# Patient Record
Sex: Female | Born: 1950
Health system: Southern US, Community
[De-identification: ages and names within clinical notes are randomized; demographics above are authoritative.]

## PROBLEM LIST (undated history)

## (undated) DIAGNOSIS — I10 Essential (primary) hypertension: Secondary | ICD-10-CM

## (undated) DIAGNOSIS — K649 Unspecified hemorrhoids: Secondary | ICD-10-CM

## (undated) DIAGNOSIS — M7541 Impingement syndrome of right shoulder: Secondary | ICD-10-CM

## (undated) DIAGNOSIS — I4891 Unspecified atrial fibrillation: Secondary | ICD-10-CM

## (undated) DIAGNOSIS — E039 Hypothyroidism, unspecified: Secondary | ICD-10-CM

## (undated) DIAGNOSIS — M1990 Unspecified osteoarthritis, unspecified site: Secondary | ICD-10-CM

## (undated) DIAGNOSIS — E785 Hyperlipidemia, unspecified: Secondary | ICD-10-CM

## (undated) DIAGNOSIS — E119 Type 2 diabetes mellitus without complications: Secondary | ICD-10-CM

## (undated) HISTORY — PX: OTHER SURGICAL HISTORY: SHX169

## (undated) HISTORY — DX: Hyperlipidemia, unspecified: E78.5

## (undated) HISTORY — PX: TUBAL LIGATION: SHX77

## (undated) HISTORY — DX: Unspecified atrial fibrillation: I48.91

## (undated) HISTORY — PX: TONSILLECTOMY: SUR1361

---

## 1998-09-04 ENCOUNTER — Other Ambulatory Visit: Admission: RE | Admit: 1998-09-04 | Discharge: 1998-09-04 | Payer: Self-pay | Admitting: Family Medicine

## 1999-07-20 ENCOUNTER — Other Ambulatory Visit: Admission: RE | Admit: 1999-07-20 | Discharge: 1999-07-20 | Payer: Self-pay | Admitting: Family Medicine

## 2000-03-04 ENCOUNTER — Other Ambulatory Visit: Admission: RE | Admit: 2000-03-04 | Discharge: 2000-03-04 | Payer: Self-pay | Admitting: Family Medicine

## 2000-08-19 ENCOUNTER — Ambulatory Visit (HOSPITAL_BASED_OUTPATIENT_CLINIC_OR_DEPARTMENT_OTHER): Admission: RE | Admit: 2000-08-19 | Discharge: 2000-08-19 | Payer: Self-pay | Admitting: *Deleted

## 2000-09-06 ENCOUNTER — Other Ambulatory Visit: Admission: RE | Admit: 2000-09-06 | Discharge: 2000-09-06 | Payer: Self-pay | Admitting: Family Medicine

## 2000-09-19 ENCOUNTER — Encounter: Admission: RE | Admit: 2000-09-19 | Discharge: 2000-09-19 | Payer: Self-pay | Admitting: Family Medicine

## 2000-09-19 ENCOUNTER — Encounter: Payer: Self-pay | Admitting: Family Medicine

## 2001-09-13 ENCOUNTER — Other Ambulatory Visit: Admission: RE | Admit: 2001-09-13 | Discharge: 2001-09-13 | Payer: Self-pay | Admitting: Family Medicine

## 2001-09-21 ENCOUNTER — Encounter: Payer: Self-pay | Admitting: Family Medicine

## 2001-09-21 ENCOUNTER — Encounter: Admission: RE | Admit: 2001-09-21 | Discharge: 2001-09-21 | Payer: Self-pay | Admitting: Family Medicine

## 2002-09-18 ENCOUNTER — Other Ambulatory Visit: Admission: RE | Admit: 2002-09-18 | Discharge: 2002-09-18 | Payer: Self-pay | Admitting: Family Medicine

## 2002-09-24 ENCOUNTER — Encounter: Payer: Self-pay | Admitting: Family Medicine

## 2002-09-24 ENCOUNTER — Encounter: Admission: RE | Admit: 2002-09-24 | Discharge: 2002-09-24 | Payer: Self-pay | Admitting: Family Medicine

## 2003-09-24 ENCOUNTER — Other Ambulatory Visit: Admission: RE | Admit: 2003-09-24 | Discharge: 2003-09-24 | Payer: Self-pay | Admitting: Family Medicine

## 2003-09-26 ENCOUNTER — Encounter: Admission: RE | Admit: 2003-09-26 | Discharge: 2003-09-26 | Payer: Self-pay | Admitting: Family Medicine

## 2003-10-18 ENCOUNTER — Encounter: Admission: RE | Admit: 2003-10-18 | Discharge: 2003-10-18 | Payer: Self-pay | Admitting: Orthopaedic Surgery

## 2004-09-28 ENCOUNTER — Encounter: Admission: RE | Admit: 2004-09-28 | Discharge: 2004-09-28 | Payer: Self-pay | Admitting: Family Medicine

## 2004-09-28 ENCOUNTER — Other Ambulatory Visit: Admission: RE | Admit: 2004-09-28 | Discharge: 2004-09-28 | Payer: Self-pay | Admitting: Family Medicine

## 2005-10-06 ENCOUNTER — Other Ambulatory Visit: Admission: RE | Admit: 2005-10-06 | Discharge: 2005-10-06 | Payer: Self-pay | Admitting: Family Medicine

## 2005-10-21 ENCOUNTER — Encounter: Admission: RE | Admit: 2005-10-21 | Discharge: 2005-10-21 | Payer: Self-pay | Admitting: Family Medicine

## 2005-11-11 ENCOUNTER — Ambulatory Visit: Payer: Self-pay | Admitting: Internal Medicine

## 2005-11-22 ENCOUNTER — Ambulatory Visit: Payer: Self-pay | Admitting: Gastroenterology

## 2006-09-27 ENCOUNTER — Other Ambulatory Visit: Admission: RE | Admit: 2006-09-27 | Discharge: 2006-09-27 | Payer: Self-pay | Admitting: Family Medicine

## 2006-10-24 ENCOUNTER — Encounter: Admission: RE | Admit: 2006-10-24 | Discharge: 2006-10-24 | Payer: Self-pay | Admitting: Family Medicine

## 2007-10-03 ENCOUNTER — Other Ambulatory Visit: Admission: RE | Admit: 2007-10-03 | Discharge: 2007-10-03 | Payer: Self-pay | Admitting: Family Medicine

## 2007-10-27 ENCOUNTER — Encounter: Admission: RE | Admit: 2007-10-27 | Discharge: 2007-10-27 | Payer: Self-pay | Admitting: Family Medicine

## 2008-10-21 ENCOUNTER — Other Ambulatory Visit: Admission: RE | Admit: 2008-10-21 | Discharge: 2008-10-21 | Payer: Self-pay | Admitting: Family Medicine

## 2008-11-04 ENCOUNTER — Encounter: Admission: RE | Admit: 2008-11-04 | Discharge: 2008-11-04 | Payer: Self-pay | Admitting: Family Medicine

## 2009-10-24 ENCOUNTER — Other Ambulatory Visit: Admission: RE | Admit: 2009-10-24 | Discharge: 2009-10-24 | Payer: Self-pay | Admitting: Family Medicine

## 2009-11-05 ENCOUNTER — Encounter: Admission: RE | Admit: 2009-11-05 | Discharge: 2009-11-05 | Payer: Self-pay | Admitting: Family Medicine

## 2010-10-28 ENCOUNTER — Other Ambulatory Visit
Admission: RE | Admit: 2010-10-28 | Discharge: 2010-10-28 | Payer: Self-pay | Source: Home / Self Care | Admitting: Family Medicine

## 2010-11-06 ENCOUNTER — Encounter
Admission: RE | Admit: 2010-11-06 | Discharge: 2010-11-06 | Payer: Self-pay | Source: Home / Self Care | Attending: Family Medicine | Admitting: Family Medicine

## 2010-12-06 ENCOUNTER — Ambulatory Visit (HOSPITAL_COMMUNITY)
Admission: RE | Admit: 2010-12-06 | Discharge: 2010-12-06 | Payer: Self-pay | Source: Home / Self Care | Attending: *Deleted | Admitting: *Deleted
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
## 2011-10-21 ENCOUNTER — Other Ambulatory Visit: Payer: Self-pay | Admitting: Family Medicine

## 2011-10-21 DIAGNOSIS — Z1231 Encounter for screening mammogram for malignant neoplasm of breast: Secondary | ICD-10-CM

## 2011-11-11 ENCOUNTER — Ambulatory Visit
Admission: RE | Admit: 2011-11-11 | Discharge: 2011-11-11 | Disposition: A | Discharge status: Home / Self Care | Payer: Federal, State, Local not specified - PPO | Source: Ambulatory Visit | Attending: Family Medicine | Admitting: Family Medicine

## 2011-11-11 DIAGNOSIS — Z1231 Encounter for screening mammogram for malignant neoplasm of breast: Secondary | ICD-10-CM

## 2011-11-18 ENCOUNTER — Other Ambulatory Visit: Payer: Self-pay | Admitting: Family Medicine

## 2011-11-18 ENCOUNTER — Other Ambulatory Visit (HOSPITAL_COMMUNITY)
Admission: RE | Admit: 2011-11-18 | Discharge: 2011-11-18 | Disposition: A | Discharge status: Home / Self Care | Payer: Federal, State, Local not specified - PPO | Source: Ambulatory Visit | Attending: Family Medicine | Admitting: Family Medicine

## 2011-11-18 DIAGNOSIS — Z01419 Encounter for gynecological examination (general) (routine) without abnormal findings: Secondary | ICD-10-CM | POA: Insufficient documentation

## 2012-10-10 ENCOUNTER — Other Ambulatory Visit: Payer: Self-pay | Admitting: Family Medicine

## 2012-10-10 DIAGNOSIS — Z1231 Encounter for screening mammogram for malignant neoplasm of breast: Secondary | ICD-10-CM

## 2012-11-14 ENCOUNTER — Ambulatory Visit
Admission: RE | Admit: 2012-11-14 | Discharge: 2012-11-14 | Disposition: A | Discharge status: Home / Self Care | Payer: Federal, State, Local not specified - PPO | Source: Ambulatory Visit | Attending: Family Medicine | Admitting: Family Medicine

## 2012-11-14 DIAGNOSIS — Z1231 Encounter for screening mammogram for malignant neoplasm of breast: Secondary | ICD-10-CM

## 2013-10-11 ENCOUNTER — Other Ambulatory Visit: Payer: Self-pay

## 2013-10-11 DIAGNOSIS — Z1231 Encounter for screening mammogram for malignant neoplasm of breast: Secondary | ICD-10-CM

## 2013-11-15 ENCOUNTER — Ambulatory Visit
Admission: RE | Admit: 2013-11-15 | Discharge: 2013-11-15 | Disposition: A | Discharge status: Home / Self Care | Payer: Federal, State, Local not specified - PPO | Source: Ambulatory Visit

## 2013-11-15 DIAGNOSIS — Z1231 Encounter for screening mammogram for malignant neoplasm of breast: Secondary | ICD-10-CM

## 2014-09-16 ENCOUNTER — Other Ambulatory Visit (HOSPITAL_COMMUNITY)
Admission: RE | Admit: 2014-09-16 | Discharge: 2014-09-16 | Disposition: A | Discharge status: Home / Self Care | Payer: Federal, State, Local not specified - PPO | Source: Ambulatory Visit | Attending: Family Medicine | Admitting: Family Medicine

## 2014-09-16 ENCOUNTER — Other Ambulatory Visit: Payer: Self-pay | Admitting: Family Medicine

## 2014-09-16 DIAGNOSIS — Z01419 Encounter for gynecological examination (general) (routine) without abnormal findings: Secondary | ICD-10-CM | POA: Insufficient documentation

## 2014-09-17 LAB — CYTOLOGY - PAP

## 2014-10-14 ENCOUNTER — Other Ambulatory Visit: Payer: Self-pay | Admitting: Family Medicine

## 2014-11-07 ENCOUNTER — Other Ambulatory Visit: Payer: Self-pay

## 2014-11-07 DIAGNOSIS — Z1231 Encounter for screening mammogram for malignant neoplasm of breast: Secondary | ICD-10-CM

## 2014-11-19 ENCOUNTER — Ambulatory Visit
Admission: RE | Admit: 2014-11-19 | Discharge: 2014-11-19 | Disposition: A | Discharge status: Home / Self Care | Payer: Federal, State, Local not specified - PPO | Source: Ambulatory Visit

## 2014-11-19 DIAGNOSIS — Z1231 Encounter for screening mammogram for malignant neoplasm of breast: Secondary | ICD-10-CM

## 2014-11-25 ENCOUNTER — Telehealth (HOSPITAL_COMMUNITY): Payer: Self-pay | Admitting: *Deleted

## 2014-11-25 NOTE — Progress Notes (Signed)
Please put orders in Epic surgery 12-09-14 pre op 12-02-14 Thanks

## 2014-11-26 ENCOUNTER — Ambulatory Visit: Payer: Self-pay | Admitting: Orthopedic Surgery

## 2014-11-26 NOTE — Progress Notes (Signed)
Preoperative surgical orders have been place into the Epic hospital system for Anne Lang on 11/26/2014, 11:15 AM  by Mickel Crow for surgery on 12-09-2014.  Preop Total Knee orders including Experal, IV Tylenol, and IV Decadron as long as there are no contraindications to the above medications. [name], PA-C

## 2014-12-02 ENCOUNTER — Ambulatory Visit (HOSPITAL_COMMUNITY)
Admission: RE | Admit: 2014-12-02 | Discharge: 2014-12-02 | Disposition: A | Discharge status: Home / Self Care | Payer: Federal, State, Local not specified - PPO | Source: Ambulatory Visit | Attending: Anesthesiology | Admitting: Anesthesiology

## 2014-12-02 ENCOUNTER — Encounter (HOSPITAL_COMMUNITY): Payer: Self-pay

## 2014-12-02 ENCOUNTER — Encounter (HOSPITAL_COMMUNITY)
Admission: RE | Admit: 2014-12-02 | Discharge: 2014-12-02 | Disposition: A | Discharge status: Home / Self Care | Payer: Federal, State, Local not specified - PPO | Source: Ambulatory Visit | Attending: Orthopedic Surgery | Admitting: Orthopedic Surgery

## 2014-12-02 DIAGNOSIS — I10 Essential (primary) hypertension: Secondary | ICD-10-CM

## 2014-12-02 HISTORY — DX: Impingement syndrome of right shoulder: M75.41

## 2014-12-02 HISTORY — DX: Unspecified hemorrhoids: K64.9

## 2014-12-02 HISTORY — DX: Hypothyroidism, unspecified: E03.9

## 2014-12-02 HISTORY — DX: Essential (primary) hypertension: I10

## 2014-12-02 HISTORY — DX: Unspecified osteoarthritis, unspecified site: M19.90

## 2014-12-02 HISTORY — DX: Type 2 diabetes mellitus without complications: E11.9

## 2014-12-02 LAB — ABO/RH: ABO/RH(D): A POS

## 2014-12-02 LAB — CBC
HCT: 43.9 % (ref 36.0–46.0)
Hemoglobin: 13.6 g/dL (ref 12.0–15.0)
MCH: 26.8 pg (ref 26.0–34.0)
MCHC: 31 g/dL (ref 30.0–36.0)
MCV: 86.4 fL (ref 78.0–100.0)
PLATELETS: 182 10*3/uL (ref 150–400)
RBC: 5.08 MIL/uL (ref 3.87–5.11)
RDW: 16.2 % — ABNORMAL HIGH (ref 11.5–15.5)
WBC: 9.9 10*3/uL (ref 4.0–10.5)

## 2014-12-02 LAB — URINE MICROSCOPIC-ADD ON

## 2014-12-02 LAB — URINALYSIS, ROUTINE W REFLEX MICROSCOPIC
Bilirubin Urine: NEGATIVE
Glucose, UA: >1000 mg/dL — AB
HGB URINE DIPSTICK: NEGATIVE
KETONES UR: NEGATIVE mg/dL
Leukocytes, UA: NEGATIVE
NITRITE: NEGATIVE
PH: 5.5 (ref 5.0–8.0)
PROTEIN: NEGATIVE mg/dL
Specific Gravity, Urine: 1.028 (ref 1.005–1.030)
Urobilinogen, UA: 0.2 mg/dL (ref 0.0–1.0)

## 2014-12-02 LAB — COMPREHENSIVE METABOLIC PANEL
ALK PHOS: 80 U/L (ref 39–117)
ALT: 33 U/L (ref 0–35)
ANION GAP: 9 (ref 5–15)
AST: 29 U/L (ref 0–37)
Albumin: 4 g/dL (ref 3.5–5.2)
BILIRUBIN TOTAL: 0.4 mg/dL (ref 0.3–1.2)
BUN: 13 mg/dL (ref 6–23)
CHLORIDE: 100 mmol/L (ref 96–112)
CO2: 30 mmol/L (ref 19–32)
Calcium: 9.8 mg/dL (ref 8.4–10.5)
Creatinine, Ser: 0.8 mg/dL (ref 0.50–1.10)
GFR calc Af Amer: 88 mL/min — ABNORMAL LOW (ref >90)
GFR calc non Af Amer: 76 mL/min — ABNORMAL LOW (ref >90)
GLUCOSE: 167 mg/dL — AB (ref 70–99)
Potassium: 3.9 mmol/L (ref 3.5–5.1)
Sodium: 139 mmol/L (ref 135–145)
TOTAL PROTEIN: 6.9 g/dL (ref 6.0–8.3)

## 2014-12-02 LAB — APTT: aPTT: 32 seconds (ref 24–37)

## 2014-12-02 LAB — SURGICAL PCR SCREEN
MRSA, PCR: NEGATIVE
STAPHYLOCOCCUS AUREUS: POSITIVE — AB

## 2014-12-02 LAB — PROTIME-INR
INR: 0.98 (ref 0.00–1.49)
Prothrombin Time: 13.1 seconds (ref 11.6–15.2)

## 2014-12-02 NOTE — Progress Notes (Signed)
Urinalysis results per PAT visit 12/02/2014 per epic sent to Dr Wynelle Link  Clearance note per chart Dr Alyson Ingles 03/04/2014

## 2014-12-02 NOTE — Progress Notes (Addendum)
Results for nasal screening positive for Staph; prescription for Mupirocin called into [facility]- spoke with Coralyn Mark. Pt notified. Lab results sent to Dr Wynelle Link.

## 2014-12-02 NOTE — Patient Instructions (Signed)
[place]  12/02/2014   Your procedure is scheduled on:   Monday December 09, 2014  Report to [facility] Main Entrance and follow signs to  Anne Lang arrive at 6:30 AM.   Call this number if you have problems the morning of surgery [phone] or Presurgical Testing [phone].   Remember:  Do not eat food or drink liquids :After Midnight. Eat a healthy snack night prior to surgery.   For Living Will and/or Health Care Power Attorney Forms: please provide copy for your medical record, may bring AM of surgery (forms should be already notarized-we do not provide this service).     Take these medicines the morning of surgery with A SIP OF WATER: Levothyroxine (Synthroid)                               You may not have any metal on your body including hair pins and piercings  Do not wear jewelry, make-up, lotions, powders,prefumes or deodorant.  Do not shave body hair  48 hours(2 days) of CHG soap use.                Do not bring valuables to the hospital. [place].  Contacts, dentures or bridgework may not be worn into surgery.  Leave suitcase in the car. After surgery it may be brought to your room.  For patients admitted to the hospital, checkout time is 11:00 AM the day of discharge.   Special Instructions: review fact sheets for MRSA information, Blood Transfusion fact sheet, Incentive Spirometry.  Remember: Type/Screen "Blue armsbands"- may not be removed once applied(would result in being retested AM of surgery, if removed). ________________________________________________________________________  [facility] - Preparing for Surgery Before surgery, you can play an important role.  Because skin is not sterile, your skin needs to be as free of germs as possible.  You can reduce the number of germs on your skin by washing with CHG (chlorahexidine gluconate) soap before surgery.  CHG is an antiseptic cleaner which kills germs  and bonds with the skin to continue killing germs even after washing. Please DO NOT use if you have an allergy to CHG or antibacterial soaps.  If your skin becomes reddened/irritated stop using the CHG and inform your nurse when you arrive at Short Stay. Do not shave (including legs and underarms) for at least 48 hours prior to the first CHG shower.  You may shave your face/neck. Please follow these instructions carefully:  1.  Shower with CHG Soap the night before surgery and the  morning of Surgery.  2.  If you choose to wash your hair, wash your hair first as usual with your  normal  shampoo.  3.  After you shampoo, rinse your hair and body thoroughly to remove the  shampoo.                           4.  Use CHG as you would any other liquid soap.  You can apply chg directly  to the skin and wash                       Gently with a scrungie or clean washcloth.  5.  Apply the CHG Soap to your body ONLY FROM THE NECK DOWN.   Do not use on face/ open  Wound or open sores. Avoid contact with eyes, ears mouth and genitals (private parts).                       Wash face,  Genitals (private parts) with your normal soap.             6.  Wash thoroughly, paying special attention to the area where your surgery  will be performed.  7.  Thoroughly rinse your body with warm water from the neck down.  8.  DO NOT shower/wash with your normal soap after using and rinsing off  the CHG Soap.                9.  Pat yourself dry with a clean towel.            10.  Wear clean pajamas.            11.  Place clean sheets on your bed the night of your first shower and do not  sleep with pets. Day of Surgery : Do not apply any lotions/deodorants the morning of surgery.  Please wear clean clothes to the hospital/surgery center.  FAILURE TO FOLLOW THESE INSTRUCTIONS MAY RESULT IN THE CANCELLATION OF YOUR SURGERY PATIENT SIGNATURE_________________________________  NURSE  SIGNATURE__________________________________  ________________________________________________________________________   Anne Lang  An incentive spirometer is a tool that can help keep your lungs clear and active. This tool measures how well you are filling your lungs with each breath. Taking long deep breaths may help reverse or decrease the chance of developing breathing (pulmonary) problems (especially infection) following:  A long period of time when you are unable to move or be active. BEFORE THE PROCEDURE   If the spirometer includes an indicator to show your best effort, your nurse or respiratory therapist will set it to a desired goal.  If possible, sit up straight or lean slightly forward. Try not to slouch.  Hold the incentive spirometer in an upright position. INSTRUCTIONS FOR USE   Sit on the edge of your bed if possible, or sit up as far as you can in bed or on a chair.  Hold the incentive spirometer in an upright position.  Breathe out normally.  Place the mouthpiece in your mouth and seal your lips tightly around it.  Breathe in slowly and as deeply as possible, raising the piston or the ball toward the top of the column.  Hold your breath for 3-5 seconds or for as long as possible. Allow the piston or ball to fall to the bottom of the column.  Remove the mouthpiece from your mouth and breathe out normally.  Rest for a few seconds and repeat Steps 1 through 7 at least 10 times every 1-2 hours when you are awake. Take your time and take a few normal breaths between deep breaths.  The spirometer may include an indicator to show your best effort. Use the indicator as a goal to work toward during each repetition.  After each set of 10 deep breaths, practice coughing to be sure your lungs are clear. If you have an incision (the cut made at the time of surgery), support your incision when coughing by placing a pillow or rolled up towels firmly against it. Once  you are able to get out of bed, walk around indoors and cough well. You may stop using the incentive spirometer when instructed by your caregiver.  RISKS AND COMPLICATIONS  Take your time so you do not  get dizzy or light-headed.  If you are in pain, you may need to take or ask for pain medication before doing incentive spirometry. It is harder to take a deep breath if you are having pain. AFTER USE  Rest and breathe slowly and easily.  It can be helpful to keep track of a log of your progress. Your caregiver can provide you with a simple table to help with this. If you are using the spirometer at home, follow these instructions: [place] IF:   You are having difficultly using the spirometer.  You have trouble using the spirometer as often as instructed.  Your pain medication is not giving enough relief while using the spirometer.  You develop fever of 100.5 F (38.1 C) or higher. SEEK IMMEDIATE MEDICAL CARE IF:   You cough up bloody sputum that had not been present before.  You develop fever of 102 F (38.9 C) or greater.  You develop worsening pain at or near the incision site. MAKE SURE YOU:   Understand these instructions.  Will watch your condition.  Will get help right away if you are not doing well or get worse. Document Released: 03/07/2007 Document Revised: 01/17/2012 Document Reviewed: 05/08/2007 ExitCare Patient Information 2014 ExitCare, [place].   ________________________________________________________________________  WHAT IS A BLOOD TRANSFUSION? Blood Transfusion Information  A transfusion is the replacement of blood or some of its parts. Blood is made up of multiple cells which provide different functions.  Red blood cells carry oxygen and are used for blood loss replacement.  White blood cells fight against infection.  Platelets control bleeding.  Plasma helps clot blood.  Other blood products are available for specialized needs, such as  hemophilia or other clotting disorders. BEFORE THE TRANSFUSION  Who gives blood for transfusions?   Healthy volunteers who are fully evaluated to make sure their blood is safe. This is blood bank blood. Transfusion therapy is the safest it has ever been in the practice of medicine. Before blood is taken from a donor, a complete history is taken to make sure that person has no history of diseases nor engages in risky social behavior (examples are intravenous drug use or sexual activity with multiple partners). The donor's travel history is screened to minimize risk of transmitting infections, such as malaria. The donated blood is tested for signs of infectious diseases, such as HIV and hepatitis. The blood is then tested to be sure it is compatible with you in order to minimize the chance of a transfusion reaction. If you or a relative donates blood, this is often done in anticipation of surgery and is not appropriate for emergency situations. It takes many days to process the donated blood. RISKS AND COMPLICATIONS Although transfusion therapy is very safe and saves many lives, the main dangers of transfusion include:   Getting an infectious disease.  Developing a transfusion reaction. This is an allergic reaction to something in the blood you were given. Every precaution is taken to prevent this. The decision to have a blood transfusion has been considered carefully by your caregiver before blood is given. Blood is not given unless the benefits outweigh the risks. AFTER THE TRANSFUSION  Right after receiving a blood transfusion, you will usually feel much better and more energetic. This is especially true if your red blood cells have gotten low (anemic). The transfusion raises the level of the red blood cells which carry oxygen, and this usually causes an energy increase.  The nurse administering the transfusion will  monitor you carefully for complications. HOME CARE INSTRUCTIONS  No special  instructions are needed after a transfusion. You may find your energy is better. Speak with your caregiver about any limitations on activity for underlying diseases you may have. SEEK MEDICAL CARE IF:   Your condition is not improving after your transfusion.  You develop redness or irritation at the intravenous (IV) site. SEEK IMMEDIATE MEDICAL CARE IF:  Any of the following symptoms occur over the next 12 hours:  Shaking chills.  You have a temperature by mouth above 102 F (38.9 C), not controlled by medicine.  Chest, back, or muscle pain.  People around you feel you are not acting correctly or are confused.  Shortness of breath or difficulty breathing.  Dizziness and fainting.  You get a rash or develop hives.  You have a decrease in urine output.  Your urine turns a dark color or changes to pink, red, or brown. Any of the following symptoms occur over the next 10 days:  You have a temperature by mouth above 102 F (38.9 C), not controlled by medicine.  Shortness of breath.  Weakness after normal activity.  The white part of the eye turns yellow (jaundice).  You have a decrease in the amount of urine or are urinating less often.  Your urine turns a dark color or changes to pink, red, or brown. Document Released: 10/22/2000 Document Revised: 01/17/2012 Document Reviewed: 06/10/2008 [facility] Patient Information 2014 [place], [place].  _______________________________________________________________________

## 2014-12-05 ENCOUNTER — Ambulatory Visit: Payer: Self-pay | Admitting: Orthopedic Surgery

## 2014-12-05 NOTE — H&P (Signed)
Anne Lang DOB: Jun 05, 1951 Single / Language: Cleophus Molt / Race: White Female Date of Admission:  12/09/2014 CC:  Left Knee Pain History of Present Illness The patient is a 64 year old female who comes in for a preoperative History and Physical. The patient is scheduled for a left total knee arthroplasty to be performed by Dr. Dione Plover. Aluisio, MD at [facility] on 12-09-2014. The patient is being followed for their left knee pain and osteoarthritis. They are now 5 month(s) out from a cortisone injection. Symptoms reported today include: pain, pain at night (at times) and swelling. The patient feels that they are doing poorly and report their pain level to be moderate. Current treatment includes: activity modification. The following medication has been used for pain control: antiinflammatory medication (Meloxicam). The patient has reported improvement of their symptoms with: Cortisone injections (but it did not last long). She states her knee is getting progressively worse. This is starting to bow on her more. A cortisone injection helped her for a very short amount of time. Unfortunately, she is getting progressively worse. She is at a stage now she wants to get this fixed so she could be more active and do the things she desires. The knee is currently prohibiting her from doing the things she desires. She is ready to proceed with knee replacement. They have been treated conservatively in the past for the above stated problem and despite conservative measures, they continue to have progressive pain and severe functional limitations and dysfunction. They have failed non-operative management including home exercise, medications, and injections. It is felt that they would benefit from undergoing total joint replacement. Risks and benefits of the procedure have been discussed with the patient and they elect to proceed with surgery. There are no active contraindications to surgery such as ongoing  infection or rapidly progressive neurological disease.  Problem List/Past Medical  Hip pain (M25.559) Impingement syndrome of right shoulder (M75.41) Right shoulder pain (M25.511) Lumbar pain (M54.5) Primary osteoarthritis of one knee, left (M17.12) DDD (degenerative disc disease), lumbar (M51.36) Diabetes Mellitus, Type II High blood pressure Impaired Vision Non-Insulin Dependent Diabetes Mellitus Osteoporosis  Allergies  Sulfanilamide *CHEMICALS* Sulfa drugs  Family History Osteoarthritis father and sister Rheumatoid Arthritis sister Hypertension First Degree Relatives. father, sister and brother Chronic Obstructive Lung Disease sister Congestive Heart Failure mother Heart Disease father, sister and brother  Social History Pain Contract no Number of flights of stairs before winded 1 Illicit drug use no Tobacco / smoke exposure no Children 3 Drug/Alcohol Rehab (Previously) no Living situation live alone Exercise Exercises daily; does running / walking and other Marital status single Current work status working full time Tobacco use Never smoker. never smoker Alcohol use current drinker; drinks wine Drug/Alcohol Rehab (Currently) no Post-Surgical Plans Home Advance Directives Living Will  Medication History  Iron Supplement (325 (65 Fe)MG Tablet, Oral) Active. Skelaxin (800MG  Tablet, Oral as needed) Active. Ramipril (10MG  Capsule, Oral) Active. Levothyroxine Sodium (150MCG Tablet, Oral) Active. (Synthroid) Meloxicam (15MG  Tablet, Oral) Active. Atorvastatin Calcium (10MG  Tablet, Oral) Active. (Lipitor) Hydrochlorothiazide (25MG  Tablet, Oral) Active. Janumet XR (50-500MG  Tablet ER 24HR, Oral) Active. Vitamin C (500MG  Tablet, Oral) Active. Bayer Aspirin EC Low Dose (81MG  Tablet DR, Oral) Active. Centrum Silver Ultra Womens (Oral) Active. Tums (500MG  Tablet Chewable, Oral) Active. Vitamin D (1000UNIT Tablet, Oral)  Active.  Past Surgical History  Tubal Ligation  Review of Systems  General Not Present- Chills, Fatigue, Fever, Memory Loss, Night Sweats, Weight Gain and Weight Loss. Skin  Not Present- Eczema, Hives, Itching, Lesions and Rash. HEENT Not Present- Dentures, Double Vision, Headache, Hearing Loss, Tinnitus and Visual Loss. Respiratory Not Present- Allergies, Chronic Cough, Coughing up blood, Shortness of breath at rest and Shortness of breath with exertion. Cardiovascular Not Present- Chest Pain, Difficulty Breathing Lying Down, Murmur, Palpitations, Racing/skipping heartbeats and Swelling. Gastrointestinal Not Present- Abdominal Pain, Bloody Stool, Constipation, Diarrhea, Difficulty Swallowing, Heartburn, Jaundice, Loss of appetitie, Nausea and Vomiting. Female Genitourinary Not Present- Blood in Urine, Discharge, Flank Pain, Incontinence, Painful Urination, Urgency, Urinary frequency, Urinary Retention, Urinating at Night and Weak urinary stream. Musculoskeletal Present- Joint Pain. Not Present- Back Pain, Joint Swelling, Morning Stiffness, Muscle Pain, Muscle Weakness and Spasms. Neurological Not Present- Blackout spells, Difficulty with balance, Dizziness, Paralysis, Tremor and Weakness. Psychiatric Not Present- Insomnia.  Vitals BP: 128/64 (Sitting, Right Arm, Standard)  Physical Exam  General Mental Status -Alert, cooperative and good historian. General Appearance-pleasant, Not in acute distress. Orientation-Oriented X3. Build & Nutrition-Well nourished and Well developed.  Head and Neck Head-normocephalic, atraumatic . Neck Global Assessment - supple, no bruit auscultated on the right, no bruit auscultated on the left.  Eye Vision-Wears corrective lenses(readers). Pupil - Bilateral-Regular and Round. Motion - Bilateral-EOMI.  Chest and Lung Exam Auscultation Breath sounds - clear at anterior chest wall and clear at posterior chest wall. Adventitious sounds  - No Adventitious sounds.  Cardiovascular Auscultation Rhythm - Regular rate and rhythm. Heart Sounds - S1 WNL and S2 WNL. Murmurs & Other Heart Sounds - Auscultation of the heart reveals - No Murmurs.  Abdomen Inspection Contour - Generalized moderate distention. Palpation/Percussion Tenderness - Abdomen is non-tender to palpation. Rigidity (guarding) - Abdomen is soft. Auscultation Auscultation of the abdomen reveals - Bowel sounds normal.  Female Genitourinary Note: Not done, not pertinent to present illness   Musculoskeletal Note: She is alert and oriented. No apparent distress. Evaluation of her hips show a normal range of motion, no discomfort. The right knee shows no effusion. Range is 0-125. There is no tenderness or instability. The left knee shows a valgus deformity. Range is 5-125. There is marked crepitus on range of motion. Moderate effusion. Tender lateral greater than medial. No instability.  RADIOGRAPHS: AP both knees and lateral are reviewed. She has bone on bone arthritis in the lateral and patellofemoral compartment of the left knee. The right knee has minimal change.   Assessment & Plan Primary osteoarthritis of one knee, left (M17.12) Note:Plan is for a Left Total Knee Replacement by Dr. Wynelle Link.  Plan is to go home with sister and children.  PCP - Dr. Maury Dus - Patient has been seen preoperatively and felt to be stable for surgery. She was instructed to hold the Janumet for 2 days prior to surgery. She was instructed also to stop her aspirin, vitamins and supplements one week prior to the surgery.  The patient does not have any contraindications and will receive TXA (tranexamic acid) prior to surgery.  Signed electronically by Joelene Millin, III PA-C

## 2014-12-09 ENCOUNTER — Inpatient Hospital Stay (HOSPITAL_COMMUNITY): Payer: Federal, State, Local not specified - PPO | Admitting: Anesthesiology

## 2014-12-09 ENCOUNTER — Encounter (HOSPITAL_COMMUNITY): Payer: Self-pay

## 2014-12-09 ENCOUNTER — Inpatient Hospital Stay (HOSPITAL_COMMUNITY)
Admission: RE | Admit: 2014-12-09 | Discharge: 2014-12-11 | DRG: 470 | Disposition: A | Discharge status: Home Health | Payer: Federal, State, Local not specified - PPO | Source: Ambulatory Visit | Attending: Orthopedic Surgery | Admitting: Orthopedic Surgery

## 2014-12-09 ENCOUNTER — Encounter (HOSPITAL_COMMUNITY)
Admission: RE | Disposition: A | Discharge status: Home Health | Payer: Self-pay | Source: Ambulatory Visit | Attending: Orthopedic Surgery

## 2014-12-09 DIAGNOSIS — E119 Type 2 diabetes mellitus without complications: Secondary | ICD-10-CM | POA: Diagnosis present

## 2014-12-09 DIAGNOSIS — I10 Essential (primary) hypertension: Secondary | ICD-10-CM | POA: Diagnosis present

## 2014-12-09 DIAGNOSIS — E039 Hypothyroidism, unspecified: Secondary | ICD-10-CM | POA: Diagnosis present

## 2014-12-09 DIAGNOSIS — M179 Osteoarthritis of knee, unspecified: Secondary | ICD-10-CM | POA: Diagnosis present

## 2014-12-09 DIAGNOSIS — Z7982 Long term (current) use of aspirin: Secondary | ICD-10-CM

## 2014-12-09 DIAGNOSIS — M1712 Unilateral primary osteoarthritis, left knee: Principal | ICD-10-CM | POA: Diagnosis present

## 2014-12-09 DIAGNOSIS — M810 Age-related osteoporosis without current pathological fracture: Secondary | ICD-10-CM | POA: Diagnosis present

## 2014-12-09 DIAGNOSIS — M1710 Unilateral primary osteoarthritis, unspecified knee: Secondary | ICD-10-CM | POA: Diagnosis present

## 2014-12-09 DIAGNOSIS — Z79899 Other long term (current) drug therapy: Secondary | ICD-10-CM

## 2014-12-09 DIAGNOSIS — M25562 Pain in left knee: Secondary | ICD-10-CM | POA: Diagnosis present

## 2014-12-09 HISTORY — PX: TOTAL KNEE ARTHROPLASTY: SHX125

## 2014-12-09 LAB — TYPE AND SCREEN
ABO/RH(D): A POS
ANTIBODY SCREEN: NEGATIVE

## 2014-12-09 LAB — GLUCOSE, CAPILLARY
GLUCOSE-CAPILLARY: 181 mg/dL — AB (ref 70–99)
Glucose-Capillary: 150 mg/dL — ABNORMAL HIGH (ref 70–99)
Glucose-Capillary: 157 mg/dL — ABNORMAL HIGH (ref 70–99)
Glucose-Capillary: 212 mg/dL — ABNORMAL HIGH (ref 70–99)
Glucose-Capillary: 151 mg/dL — ABNORMAL HIGH (ref 70–99)

## 2014-12-09 SURGERY — ARTHROPLASTY, KNEE, TOTAL
Anesthesia: Spinal | Site: Knee | Laterality: Left

## 2014-12-09 MED ORDER — DOCUSATE SODIUM 100 MG PO CAPS
100.0000 mg | ORAL_CAPSULE | Freq: Two times a day (BID) | ORAL | Status: DC
  Administered 2014-12-09 – 2014-12-11 (×5): 100 mg via ORAL

## 2014-12-09 MED ORDER — PROPOFOL 10 MG/ML IV BOLUS
INTRAVENOUS | Status: AC
  Filled 2014-12-09: qty 20

## 2014-12-09 MED ORDER — DIPHENHYDRAMINE HCL 12.5 MG/5ML PO ELIX: 12.5000 mg | ORAL_SOLUTION | ORAL | Status: DC | PRN

## 2014-12-09 MED ORDER — ACETAMINOPHEN 10 MG/ML IV SOLN
1000.0000 mg | Freq: Once | INTRAVENOUS | Status: AC
  Administered 2014-12-09: 1000 mg via INTRAVENOUS
  Filled 2014-12-09 (×2): qty 100

## 2014-12-09 MED ORDER — FLEET ENEMA 7-19 GM/118ML RE ENEM: 1.0000 | ENEMA | Freq: Once | RECTAL | Status: AC | PRN

## 2014-12-09 MED ORDER — FENTANYL CITRATE 0.05 MG/ML IJ SOLN
INTRAMUSCULAR | Status: DC | PRN
  Administered 2014-12-09 (×2): 50 ug via INTRAVENOUS

## 2014-12-09 MED ORDER — SODIUM CHLORIDE 0.9 % IV SOLN: INTRAVENOUS | Status: DC

## 2014-12-09 MED ORDER — BISACODYL 10 MG RE SUPP: 10.0000 mg | Freq: Every day | RECTAL | Status: DC | PRN

## 2014-12-09 MED ORDER — TRAMADOL HCL 50 MG PO TABS: 50.0000 mg | ORAL_TABLET | Freq: Four times a day (QID) | ORAL | Status: DC | PRN

## 2014-12-09 MED ORDER — SODIUM CHLORIDE 0.9 % IJ SOLN
INTRAMUSCULAR | Status: DC | PRN
  Administered 2014-12-09: 30 mL via INTRAVENOUS

## 2014-12-09 MED ORDER — ATORVASTATIN CALCIUM 10 MG PO TABS
5.0000 mg | ORAL_TABLET | Freq: Every day | ORAL | Status: DC
  Administered 2014-12-09 – 2014-12-11 (×3): 5 mg via ORAL
  Filled 2014-12-09 (×3): qty 0.5

## 2014-12-09 MED ORDER — POTASSIUM CHLORIDE IN NACL 20-0.9 MEQ/L-% IV SOLN
INTRAVENOUS | Status: DC
  Administered 2014-12-09: 15:00:00 via INTRAVENOUS
  Filled 2014-12-09 (×2): qty 1000

## 2014-12-09 MED ORDER — MEPERIDINE HCL 50 MG/ML IJ SOLN: 6.2500 mg | INTRAMUSCULAR | Status: DC | PRN

## 2014-12-09 MED ORDER — PROMETHAZINE HCL 25 MG/ML IJ SOLN: 6.2500 mg | INTRAMUSCULAR | Status: DC | PRN

## 2014-12-09 MED ORDER — BUPIVACAINE LIPOSOME 1.3 % IJ SUSP
20.0000 mL | Freq: Once | INTRAMUSCULAR | Status: DC
  Filled 2014-12-09: qty 20

## 2014-12-09 MED ORDER — METOCLOPRAMIDE HCL 10 MG PO TABS: 5.0000 mg | ORAL_TABLET | Freq: Three times a day (TID) | ORAL | Status: DC | PRN

## 2014-12-09 MED ORDER — SODIUM CHLORIDE 0.9 % IJ SOLN
INTRAMUSCULAR | Status: AC
  Filled 2014-12-09: qty 50

## 2014-12-09 MED ORDER — OXYCODONE HCL 5 MG PO TABS
5.0000 mg | ORAL_TABLET | ORAL | Status: DC | PRN
  Administered 2014-12-09: 5 mg via ORAL
  Administered 2014-12-09 (×3): 10 mg via ORAL
  Administered 2014-12-09: 5 mg via ORAL
  Administered 2014-12-10 – 2014-12-11 (×10): 10 mg via ORAL
  Filled 2014-12-09 (×7): qty 2
  Filled 2014-12-09: qty 1
  Filled 2014-12-09 (×6): qty 2
  Filled 2014-12-09: qty 1

## 2014-12-09 MED ORDER — DEXAMETHASONE SODIUM PHOSPHATE 10 MG/ML IJ SOLN
INTRAMUSCULAR | Status: AC
  Filled 2014-12-09: qty 1

## 2014-12-09 MED ORDER — HYDROCHLOROTHIAZIDE 25 MG PO TABS
25.0000 mg | ORAL_TABLET | Freq: Every morning | ORAL | Status: DC
  Administered 2014-12-09 – 2014-12-11 (×3): 25 mg via ORAL
  Filled 2014-12-09 (×3): qty 1

## 2014-12-09 MED ORDER — ONDANSETRON HCL 4 MG/2ML IJ SOLN
INTRAMUSCULAR | Status: AC
  Filled 2014-12-09: qty 2

## 2014-12-09 MED ORDER — CHLORHEXIDINE GLUCONATE 4 % EX LIQD: 60.0000 mL | Freq: Once | CUTANEOUS | Status: DC

## 2014-12-09 MED ORDER — ONDANSETRON HCL 4 MG/2ML IJ SOLN
INTRAMUSCULAR | Status: DC | PRN
  Administered 2014-12-09: 4 mg via INTRAVENOUS

## 2014-12-09 MED ORDER — DEXAMETHASONE SODIUM PHOSPHATE 10 MG/ML IJ SOLN
10.0000 mg | Freq: Once | INTRAMUSCULAR | Status: AC
  Administered 2014-12-10: 10 mg via INTRAVENOUS
  Filled 2014-12-09: qty 1

## 2014-12-09 MED ORDER — BUPIVACAINE IN DEXTROSE 0.75-8.25 % IT SOLN
INTRATHECAL | Status: DC | PRN
  Administered 2014-12-09: 1.5 mL via INTRATHECAL

## 2014-12-09 MED ORDER — MORPHINE SULFATE 2 MG/ML IJ SOLN
1.0000 mg | INTRAMUSCULAR | Status: DC | PRN
  Administered 2014-12-09 (×2): 1 mg via INTRAVENOUS
  Administered 2014-12-09 – 2014-12-10 (×4): 2 mg via INTRAVENOUS
  Filled 2014-12-09 (×5): qty 1

## 2014-12-09 MED ORDER — ACETAMINOPHEN 325 MG PO TABS: 650.0000 mg | ORAL_TABLET | Freq: Four times a day (QID) | ORAL | Status: DC | PRN

## 2014-12-09 MED ORDER — MIDAZOLAM HCL 5 MG/5ML IJ SOLN
INTRAMUSCULAR | Status: DC | PRN
  Administered 2014-12-09 (×2): 1 mg via INTRAVENOUS

## 2014-12-09 MED ORDER — LACTATED RINGERS IV SOLN
INTRAVENOUS | Status: DC | PRN
  Administered 2014-12-09 (×3): via INTRAVENOUS

## 2014-12-09 MED ORDER — PROPOFOL INFUSION 10 MG/ML OPTIME
INTRAVENOUS | Status: DC | PRN
  Administered 2014-12-09: 75 ug/kg/min via INTRAVENOUS

## 2014-12-09 MED ORDER — BUPIVACAINE LIPOSOME 1.3 % IJ SUSP
INTRAMUSCULAR | Status: DC | PRN
  Administered 2014-12-09: 20 mL

## 2014-12-09 MED ORDER — INSULIN ASPART 100 UNIT/ML ~~LOC~~ SOLN
0.0000 [IU] | Freq: Three times a day (TID) | SUBCUTANEOUS | Status: DC
  Administered 2014-12-09: 5 [IU] via SUBCUTANEOUS
  Administered 2014-12-10: 8 [IU] via SUBCUTANEOUS
  Administered 2014-12-10: 3 [IU] via SUBCUTANEOUS
  Administered 2014-12-10: 5 [IU] via SUBCUTANEOUS
  Administered 2014-12-11: 2 [IU] via SUBCUTANEOUS

## 2014-12-09 MED ORDER — BUPIVACAINE HCL 0.25 % IJ SOLN
INTRAMUSCULAR | Status: DC | PRN
  Administered 2014-12-09: 20 mL

## 2014-12-09 MED ORDER — CEFAZOLIN SODIUM-DEXTROSE 2-3 GM-% IV SOLR
INTRAVENOUS | Status: AC
  Filled 2014-12-09: qty 50

## 2014-12-09 MED ORDER — MENTHOL 3 MG MT LOZG
1.0000 | LOZENGE | OROMUCOSAL | Status: DC | PRN
  Filled 2014-12-09: qty 9

## 2014-12-09 MED ORDER — DEXAMETHASONE SODIUM PHOSPHATE 10 MG/ML IJ SOLN
10.0000 mg | Freq: Once | INTRAMUSCULAR | Status: AC
  Administered 2014-12-09: 10 mg via INTRAVENOUS

## 2014-12-09 MED ORDER — LACTATED RINGERS IV SOLN: INTRAVENOUS | Status: DC

## 2014-12-09 MED ORDER — CEFAZOLIN SODIUM-DEXTROSE 2-3 GM-% IV SOLR
2.0000 g | Freq: Four times a day (QID) | INTRAVENOUS | Status: AC
  Administered 2014-12-09 (×2): 2 g via INTRAVENOUS
  Filled 2014-12-09 (×2): qty 50

## 2014-12-09 MED ORDER — FERROUS SULFATE 325 (65 FE) MG PO TABS
325.0000 mg | ORAL_TABLET | Freq: Every day | ORAL | Status: DC
  Administered 2014-12-10 – 2014-12-11 (×2): 325 mg via ORAL
  Filled 2014-12-09 (×3): qty 1

## 2014-12-09 MED ORDER — SODIUM CHLORIDE 0.9 % IV SOLN
1000.0000 mg | INTRAVENOUS | Status: AC
  Administered 2014-12-09: 1000 mg via INTRAVENOUS
  Filled 2014-12-09: qty 10

## 2014-12-09 MED ORDER — ONDANSETRON HCL 4 MG PO TABS: 4.0000 mg | ORAL_TABLET | Freq: Four times a day (QID) | ORAL | Status: DC | PRN

## 2014-12-09 MED ORDER — RIVAROXABAN 10 MG PO TABS
10.0000 mg | ORAL_TABLET | Freq: Every day | ORAL | Status: DC
  Administered 2014-12-10 – 2014-12-11 (×2): 10 mg via ORAL
  Filled 2014-12-09 (×3): qty 1

## 2014-12-09 MED ORDER — FENTANYL CITRATE 0.05 MG/ML IJ SOLN
INTRAMUSCULAR | Status: AC
  Filled 2014-12-09: qty 2

## 2014-12-09 MED ORDER — METHOCARBAMOL 500 MG PO TABS
500.0000 mg | ORAL_TABLET | Freq: Four times a day (QID) | ORAL | Status: DC | PRN
  Administered 2014-12-09 – 2014-12-11 (×5): 500 mg via ORAL
  Filled 2014-12-09 (×5): qty 1

## 2014-12-09 MED ORDER — MIDAZOLAM HCL 2 MG/2ML IJ SOLN
INTRAMUSCULAR | Status: AC
  Filled 2014-12-09: qty 2

## 2014-12-09 MED ORDER — CEFAZOLIN SODIUM-DEXTROSE 2-3 GM-% IV SOLR
2.0000 g | INTRAVENOUS | Status: AC
  Administered 2014-12-09: 2 g via INTRAVENOUS

## 2014-12-09 MED ORDER — METOCLOPRAMIDE HCL 5 MG/ML IJ SOLN: 5.0000 mg | Freq: Three times a day (TID) | INTRAMUSCULAR | Status: DC | PRN

## 2014-12-09 MED ORDER — ACETAMINOPHEN 500 MG PO TABS
1000.0000 mg | ORAL_TABLET | Freq: Four times a day (QID) | ORAL | Status: AC
  Administered 2014-12-09 – 2014-12-10 (×4): 1000 mg via ORAL
  Filled 2014-12-09 (×4): qty 2

## 2014-12-09 MED ORDER — KETOROLAC TROMETHAMINE 15 MG/ML IJ SOLN
7.5000 mg | Freq: Four times a day (QID) | INTRAMUSCULAR | Status: AC | PRN
  Administered 2014-12-09: 7.5 mg via INTRAVENOUS
  Filled 2014-12-09: qty 1

## 2014-12-09 MED ORDER — HYDROMORPHONE HCL 1 MG/ML IJ SOLN
INTRAMUSCULAR | Status: AC
  Filled 2014-12-09: qty 1

## 2014-12-09 MED ORDER — BUPIVACAINE HCL (PF) 0.25 % IJ SOLN
INTRAMUSCULAR | Status: AC
  Filled 2014-12-09: qty 30

## 2014-12-09 MED ORDER — 0.9 % SODIUM CHLORIDE (POUR BTL) OPTIME
TOPICAL | Status: DC | PRN
  Administered 2014-12-09: 1000 mL

## 2014-12-09 MED ORDER — STERILE WATER FOR IRRIGATION IR SOLN
Status: DC | PRN
  Administered 2014-12-09: 1500 mL

## 2014-12-09 MED ORDER — ACETAMINOPHEN 650 MG RE SUPP: 650.0000 mg | Freq: Four times a day (QID) | RECTAL | Status: DC | PRN

## 2014-12-09 MED ORDER — LEVOTHYROXINE SODIUM 150 MCG PO TABS
150.0000 ug | ORAL_TABLET | Freq: Every day | ORAL | Status: DC
  Administered 2014-12-10 – 2014-12-11 (×2): 150 ug via ORAL
  Filled 2014-12-09 (×3): qty 1

## 2014-12-09 MED ORDER — ONDANSETRON HCL 4 MG/2ML IJ SOLN: 4.0000 mg | Freq: Four times a day (QID) | INTRAMUSCULAR | Status: DC | PRN

## 2014-12-09 MED ORDER — POLYETHYLENE GLYCOL 3350 17 G PO PACK: 17.0000 g | PACK | Freq: Every day | ORAL | Status: DC | PRN

## 2014-12-09 MED ORDER — HYDROMORPHONE HCL 1 MG/ML IJ SOLN
0.2500 mg | INTRAMUSCULAR | Status: DC | PRN
  Administered 2014-12-09 (×2): 0.5 mg via INTRAVENOUS

## 2014-12-09 MED ORDER — RAMIPRIL 10 MG PO CAPS
10.0000 mg | ORAL_CAPSULE | Freq: Every morning | ORAL | Status: DC
  Administered 2014-12-09 – 2014-12-11 (×3): 10 mg via ORAL
  Filled 2014-12-09 (×3): qty 1

## 2014-12-09 MED ORDER — EMPAGLIFLOZIN 25 MG PO TABS
25.0000 mg | ORAL_TABLET | Freq: Every day | ORAL | Status: DC
  Administered 2014-12-11: 25 mg via ORAL
  Filled 2014-12-09 (×4): qty 25

## 2014-12-09 MED ORDER — DEXTROSE 5 % IV SOLN
500.0000 mg | Freq: Four times a day (QID) | INTRAVENOUS | Status: DC | PRN
  Administered 2014-12-09: 500 mg via INTRAVENOUS
  Filled 2014-12-09 (×2): qty 5

## 2014-12-09 MED ORDER — PHENOL 1.4 % MT LIQD
1.0000 | OROMUCOSAL | Status: DC | PRN
Start: 2014-12-09 — End: 2014-12-11
  Filled 2014-12-09: qty 177

## 2014-12-09 SURGICAL SUPPLY — 65 items
BAG SPEC THK2 15X12 ZIP CLS (MISCELLANEOUS) ×1
BAG ZIPLOCK 12X15 (MISCELLANEOUS) ×3 IMPLANT
BANDAGE ELASTIC 6 VELCRO ST LF (GAUZE/BANDAGES/DRESSINGS) ×3 IMPLANT
BANDAGE ESMARK 6X9 LF (GAUZE/BANDAGES/DRESSINGS) ×1 IMPLANT
BLADE SAG 18X100X1.27 (BLADE) ×3 IMPLANT
BLADE SAW SGTL 11.0X1.19X90.0M (BLADE) ×3 IMPLANT
BNDG CMPR 82X61 PLY HI ABS (GAUZE/BANDAGES/DRESSINGS) ×1
BNDG CMPR 9X6 STRL LF SNTH (GAUZE/BANDAGES/DRESSINGS) ×1
BNDG CONFORM 6X.82 1P STRL (GAUZE/BANDAGES/DRESSINGS) ×2 IMPLANT
BNDG ESMARK 6X9 LF (GAUZE/BANDAGES/DRESSINGS) ×3
BOWL SMART MIX CTS (DISPOSABLE) ×3 IMPLANT
CAPT KNEE TOTAL 3 ATTUNE ×2 IMPLANT
CEMENT HV SMART SET (Cement) ×6 IMPLANT
CLOSURE WOUND 1/2 X4 (GAUZE/BANDAGES/DRESSINGS) ×2
CUFF TOURN SGL QUICK 34 (TOURNIQUET CUFF) ×3
CUFF TRNQT CYL 34X4X40X1 (TOURNIQUET CUFF) ×1 IMPLANT
DECANTER SPIKE VIAL GLASS SM (MISCELLANEOUS) ×3 IMPLANT
DRAPE EXTREMITY T 121X128X90 (DRAPE) ×3 IMPLANT
DRAPE POUCH INSTRU U-SHP 10X18 (DRAPES) ×3 IMPLANT
DRAPE U-SHAPE 47X51 STRL (DRAPES) ×3 IMPLANT
DRSG ADAPTIC 3X8 NADH LF (GAUZE/BANDAGES/DRESSINGS) ×3 IMPLANT
DRSG PAD ABDOMINAL 8X10 ST (GAUZE/BANDAGES/DRESSINGS) ×3 IMPLANT
DURAPREP 26ML APPLICATOR (WOUND CARE) ×3 IMPLANT
ELECT REM PT RETURN 9FT ADLT (ELECTROSURGICAL) ×3
ELECTRODE REM PT RTRN 9FT ADLT (ELECTROSURGICAL) ×1 IMPLANT
EVACUATOR 1/8 PVC DRAIN (DRAIN) ×3 IMPLANT
FACESHIELD WRAPAROUND (MASK) ×15 IMPLANT
FACESHIELD WRAPAROUND OR TEAM (MASK) ×5 IMPLANT
GAUZE SPONGE 4X4 12PLY STRL (GAUZE/BANDAGES/DRESSINGS) ×3 IMPLANT
GLOVE BIO SURGEON STRL SZ7.5 (GLOVE) IMPLANT
GLOVE BIO SURGEON STRL SZ8 (GLOVE) ×3 IMPLANT
GLOVE BIOGEL PI IND STRL 6.5 (GLOVE) IMPLANT
GLOVE BIOGEL PI IND STRL 8 (GLOVE) ×1 IMPLANT
GLOVE BIOGEL PI INDICATOR 6.5 (GLOVE)
GLOVE BIOGEL PI INDICATOR 8 (GLOVE) ×2
GLOVE SURG SS PI 6.5 STRL IVOR (GLOVE) IMPLANT
GOWN STRL REUS W/TWL LRG LVL3 (GOWN DISPOSABLE) ×3 IMPLANT
GOWN STRL REUS W/TWL XL LVL3 (GOWN DISPOSABLE) IMPLANT
HANDPIECE INTERPULSE COAX TIP (DISPOSABLE) ×3
IMMOBILIZER KNEE 20 (SOFTGOODS) ×5 IMPLANT
IMMOBILIZER KNEE 20 THIGH 36 (SOFTGOODS) ×1 IMPLANT
KIT BASIN OR (CUSTOM PROCEDURE TRAY) ×3 IMPLANT
MANIFOLD NEPTUNE II (INSTRUMENTS) ×3 IMPLANT
NDL SAFETY ECLIPSE 18X1.5 (NEEDLE) ×2 IMPLANT
NEEDLE HYPO 18GX1.5 SHARP (NEEDLE) ×6
NS IRRIG 1000ML POUR BTL (IV SOLUTION) ×3 IMPLANT
PACK TOTAL JOINT (CUSTOM PROCEDURE TRAY) ×3 IMPLANT
PAD ABD 8X10 STRL (GAUZE/BANDAGES/DRESSINGS) ×2 IMPLANT
PADDING CAST COTTON 6X4 STRL (CAST SUPPLIES) ×9 IMPLANT
POSITIONER SURGICAL ARM (MISCELLANEOUS) ×3 IMPLANT
SET HNDPC FAN SPRY TIP SCT (DISPOSABLE) ×1 IMPLANT
STRIP CLOSURE SKIN 1/2X4 (GAUZE/BANDAGES/DRESSINGS) ×4 IMPLANT
SUCTION FRAZIER 12FR DISP (SUCTIONS) ×3 IMPLANT
SUT MNCRL AB 4-0 PS2 18 (SUTURE) ×3 IMPLANT
SUT VIC AB 2-0 CT1 27 (SUTURE) ×9
SUT VIC AB 2-0 CT1 TAPERPNT 27 (SUTURE) ×3 IMPLANT
SUT VLOC 180 0 24IN GS25 (SUTURE) ×3 IMPLANT
SYR 20CC LL (SYRINGE) ×3 IMPLANT
SYR 50ML LL SCALE MARK (SYRINGE) ×3 IMPLANT
TAPE STRIPS DRAPE STRL (GAUZE/BANDAGES/DRESSINGS) ×3 IMPLANT
TOWEL OR 17X26 10 PK STRL BLUE (TOWEL DISPOSABLE) ×3 IMPLANT
TOWEL OR NON WOVEN STRL DISP B (DISPOSABLE) IMPLANT
TRAY FOLEY CATH 14FRSI W/METER (CATHETERS) ×3 IMPLANT
WATER STERILE IRR 1500ML POUR (IV SOLUTION) ×3 IMPLANT
WRAP KNEE MAXI GEL POST OP (GAUZE/BANDAGES/DRESSINGS) ×3 IMPLANT

## 2014-12-09 NOTE — Anesthesia Postprocedure Evaluation (Signed)
  Anesthesia Post-op Note  Patient: Anne Lang  Procedure(s) Performed: Procedure(s) (LRB): LEFT TOTAL KNEE ARTHROPLASTY (Left)  Patient Location: PACU  Anesthesia Type: Spinal  Level of Consciousness: awake and alert   Airway and Oxygen Therapy: Patient Spontanous Breathing  Post-op Pain: mild  Post-op Assessment: Post-op Vital signs reviewed, Patient's Cardiovascular Status Stable, Respiratory Function Stable, Patent Airway and No signs of Nausea or vomiting  Last Vitals:  Filed Vitals:   12/09/14 1145  BP: 111/57  Pulse: 54  Temp: 36.4 C  Resp: 15    Post-op Vital Signs: stable   Complications: No apparent anesthesia complications

## 2014-12-09 NOTE — H&P (View-Only) (Signed)
Anne Lang DOB: 1951/02/23 Single / Language: Cleophus Molt / Race: White Female Date of Admission:  12/09/2014 CC:  Left Knee Pain History of Present Illness The patient is a 64 year old female who comes in for a preoperative History and Physical. The patient is scheduled for a left total knee arthroplasty to be performed by Dr. Dione Plover. Aluisio, MD at [facility] on 12-09-2014. The patient is being followed for their left knee pain and osteoarthritis. They are now 5 month(s) out from a cortisone injection. Symptoms reported today include: pain, pain at night (at times) and swelling. The patient feels that they are doing poorly and report their pain level to be moderate. Current treatment includes: activity modification. The following medication has been used for pain control: antiinflammatory medication (Meloxicam). The patient has reported improvement of their symptoms with: Cortisone injections (but it did not last long). She states her knee is getting progressively worse. This is starting to bow on her more. A cortisone injection helped her for a very short amount of time. Unfortunately, she is getting progressively worse. She is at a stage now she wants to get this fixed so she could be more active and do the things she desires. The knee is currently prohibiting her from doing the things she desires. She is ready to proceed with knee replacement. They have been treated conservatively in the past for the above stated problem and despite conservative measures, they continue to have progressive pain and severe functional limitations and dysfunction. They have failed non-operative management including home exercise, medications, and injections. It is felt that they would benefit from undergoing total joint replacement. Risks and benefits of the procedure have been discussed with the patient and they elect to proceed with surgery. There are no active contraindications to surgery such as ongoing  infection or rapidly progressive neurological disease.  Problem List/Past Medical  Hip pain (M25.559) Impingement syndrome of right shoulder (M75.41) Right shoulder pain (M25.511) Lumbar pain (M54.5) Primary osteoarthritis of one knee, left (M17.12) DDD (degenerative disc disease), lumbar (M51.36) Diabetes Mellitus, Type II High blood pressure Impaired Vision Non-Insulin Dependent Diabetes Mellitus Osteoporosis  Allergies  Sulfanilamide *CHEMICALS* Sulfa drugs  Family History Osteoarthritis father and sister Rheumatoid Arthritis sister Hypertension First Degree Relatives. father, sister and brother Chronic Obstructive Lung Disease sister Congestive Heart Failure mother Heart Disease father, sister and brother  Social History Pain Contract no Number of flights of stairs before winded 1 Illicit drug use no Tobacco / smoke exposure no Children 3 Drug/Alcohol Rehab (Previously) no Living situation live alone Exercise Exercises daily; does running / walking and other Marital status single Current work status working full time Tobacco use Never smoker. never smoker Alcohol use current drinker; drinks wine Drug/Alcohol Rehab (Currently) no Post-Surgical Plans Home Advance Directives Living Will  Medication History  Iron Supplement (325 (65 Fe)MG Tablet, Oral) Active. Skelaxin (800MG  Tablet, Oral as needed) Active. Ramipril (10MG  Capsule, Oral) Active. Levothyroxine Sodium (150MCG Tablet, Oral) Active. (Synthroid) Meloxicam (15MG  Tablet, Oral) Active. Atorvastatin Calcium (10MG  Tablet, Oral) Active. (Lipitor) Hydrochlorothiazide (25MG  Tablet, Oral) Active. Janumet XR (50-500MG  Tablet ER 24HR, Oral) Active. Vitamin C (500MG  Tablet, Oral) Active. Bayer Aspirin EC Low Dose (81MG  Tablet DR, Oral) Active. Centrum Silver Ultra Womens (Oral) Active. Tums (500MG  Tablet Chewable, Oral) Active. Vitamin D (1000UNIT Tablet, Oral)  Active.  Past Surgical History  Tubal Ligation  Review of Systems  General Not Present- Chills, Fatigue, Fever, Memory Loss, Night Sweats, Weight Gain and Weight Loss. Skin  Not Present- Eczema, Hives, Itching, Lesions and Rash. HEENT Not Present- Dentures, Double Vision, Headache, Hearing Loss, Tinnitus and Visual Loss. Respiratory Not Present- Allergies, Chronic Cough, Coughing up blood, Shortness of breath at rest and Shortness of breath with exertion. Cardiovascular Not Present- Chest Pain, Difficulty Breathing Lying Down, Murmur, Palpitations, Racing/skipping heartbeats and Swelling. Gastrointestinal Not Present- Abdominal Pain, Bloody Stool, Constipation, Diarrhea, Difficulty Swallowing, Heartburn, Jaundice, Loss of appetitie, Nausea and Vomiting. Female Genitourinary Not Present- Blood in Urine, Discharge, Flank Pain, Incontinence, Painful Urination, Urgency, Urinary frequency, Urinary Retention, Urinating at Night and Weak urinary stream. Musculoskeletal Present- Joint Pain. Not Present- Back Pain, Joint Swelling, Morning Stiffness, Muscle Pain, Muscle Weakness and Spasms. Neurological Not Present- Blackout spells, Difficulty with balance, Dizziness, Paralysis, Tremor and Weakness. Psychiatric Not Present- Insomnia.  Vitals BP: 128/64 (Sitting, Right Arm, Standard)  Physical Exam  General Mental Status -Alert, cooperative and good historian. General Appearance-pleasant, Not in acute distress. Orientation-Oriented X3. Build & Nutrition-Well nourished and Well developed.  Head and Neck Head-normocephalic, atraumatic . Neck Global Assessment - supple, no bruit auscultated on the right, no bruit auscultated on the left.  Eye Vision-Wears corrective lenses(readers). Pupil - Bilateral-Regular and Round. Motion - Bilateral-EOMI.  Chest and Lung Exam Auscultation Breath sounds - clear at anterior chest wall and clear at posterior chest wall. Adventitious sounds  - No Adventitious sounds.  Cardiovascular Auscultation Rhythm - Regular rate and rhythm. Heart Sounds - S1 WNL and S2 WNL. Murmurs & Other Heart Sounds - Auscultation of the heart reveals - No Murmurs.  Abdomen Inspection Contour - Generalized moderate distention. Palpation/Percussion Tenderness - Abdomen is non-tender to palpation. Rigidity (guarding) - Abdomen is soft. Auscultation Auscultation of the abdomen reveals - Bowel sounds normal.  Female Genitourinary Note: Not done, not pertinent to present illness   Musculoskeletal Note: She is alert and oriented. No apparent distress. Evaluation of her hips show a normal range of motion, no discomfort. The right knee shows no effusion. Range is 0-125. There is no tenderness or instability. The left knee shows a valgus deformity. Range is 5-125. There is marked crepitus on range of motion. Moderate effusion. Tender lateral greater than medial. No instability.  RADIOGRAPHS: AP both knees and lateral are reviewed. She has bone on bone arthritis in the lateral and patellofemoral compartment of the left knee. The right knee has minimal change.   Assessment & Plan Primary osteoarthritis of one knee, left (M17.12) Note:Plan is for a Left Total Knee Replacement by Dr. Wynelle Link.  Plan is to go home with sister and children.  PCP - Dr. Maury Dus - Patient has been seen preoperatively and felt to be stable for surgery. She was instructed to hold the Janumet for 2 days prior to surgery. She was instructed also to stop her aspirin, vitamins and supplements one week prior to the surgery.  The patient does not have any contraindications and will receive TXA (tranexamic acid) prior to surgery.  Signed electronically by Joelene Millin, III PA-C

## 2014-12-09 NOTE — Op Note (Signed)
Pre-operative diagnosis- Osteoarthritis Left knee(s)  Post-operative diagnosis- Osteoarthritis  Left knee(s)  Procedure-   Left Total Knee Arthroplasty  Surgeon- Dione Plover. Merrik Puebla, MD  Assistant- Ardeen Jourdain, PA-C   Anesthesia-  Spinal   EBL- * No blood loss amount entered *   Drains Hemovac   Tourniquet time * Missing tourniquet times found for documented tourniquets in log:  [id number] * Total Tourniquet Time Documented: Thigh (Left) - 34 minutes Total: Thigh (Left) - 34 minutes    Complications- None  Condition-PACU - hemodynamically stable.   Brief Clinical Note  Anne Lang is a 64 y.o. year old female with end stage OA of her left knee with progressively worsening pain and dysfunction. She has constant pain, with activity and at rest and significant functional deficits with difficulties even with ADLs. She has had extensive non-op management including analgesics, injections of cortisone and viscosupplements, and home exercise program, but remains in significant pain with significant dysfunction. Radiographs show bone on bone arthritis lateral and patellofemoral with valgus deformity. She presents now for left Total Knee Arthroplasty.    Procedure in detail---       The patient is brought into the operating room and positioned supine on the operating table. After successful administration of Spinal anesthetic, a tourniquet is placed high on the Left thigh(s) and the lower extremity is prepped and draped in the usual sterile fashion. Time out is performed by the operating team and then the Left  lower extremity is wrapped in Esmarch, knee flexed and the tourniquet inflated to 300 mmHg.       A midline incision is made with a ten blade through the subcutaneous tissue to the level of the extensor mechanism. A fresh blade is used to make a lateral parapatellar arthrotomy due to the patients' valgus deformity. Soft tissue over the proximal lateral tibia is subperiosteally elevated to  the joint line with a knife to the posterolateral corner but not including the structures of the posterolateral corner. Soft tissue over the proximal medial tibia is elevated with attention being paid to avoiding the patellar tendon on the tibial tubercle. The patella is everted medially, knee flexed 90 degrees and the ACL and PCL are removed. Findings are bone on bone lateral and patellofemoral with large global osteophytes. .       The drill is used to create a starting hole in the distal femur and the canal is thoroughly irrigated with sterile saline to remove the fatty contents. The 5 degree Left  valgus alignment guide is placed into the femoral canal and the distal femoral cutting block is pinned to remove 10  mm off the distal femur. Resection is made with an oscillating saw.      The tibia is subluxed forward and the menisci are removed. The extramedullary alignment guide is placed referencing proximally at the medial aspect of the tibial tubercle and distally along the second metatarsal axis and tibial crest. The block is pinned to remove 64mm off the more deficient lateral side. Resection is made with an oscillating saw. Size 6  is the most appropriate size for the tibia and the proximal tibia is prepared with the modular drill and keel punch for that size.      The femoral sizing guide is placed and size 6  is most appropriate. Rotation is marked off the epicondylar axis and confirmed by creating a rectangular flexion gap at 90 degrees. The size 6  cutting block is pinned in this rotation and the  anterior, posterior and chamfer cuts are made with the oscillating saw. The intercondylar block is then placed and that cut is made.      Trial size 6  tibial component, trial size 6  posterior stabilized femur and a 10  mm posterior stabilized rotating platform insert trial is placed. Full extension is achieved with excellent varus/valgus and   anterior/posterior balance throughout full range of motion. The  patella is everted and thickness measured to be 22  mm. Free hand resection is taken to 12 mm, a 38 template is placed, lug holes are drilled, trial patella is placed, and it tracks normally. Osteophytes are removed off the posterior femur with the trial in place. All trials are removed and the cut bone surfaces prepared with pulsatile lavage. Cement is mixed and once ready for implantation, the size 6  tibial implant, size 6 posterior stabilized femoral component, and the size 38  patella are cemented in place and the patella is held with the clamp. The trial insert is placed and the knee held in full extension. The Exparel (20 ml mixed with 30 ml saline) and then 20 ml of .25% Bupivicaine is injected into the extensor mechanism, posterior capsule, medial and lateral gutters and subcutaneous tissues. All extruded cement is removed and once the cement is hard the permanent 10  mm posterior stabilized rotating platform insert is placed into the tibial tray.      The wound is copiously irrigated with saline solution and the tourniquet is released for a total   tourniquet time of 34  minutes. Bleeding is identified and controlled with electrocautery. The extensor mechanism is closed with interrupted #1 V-loc leaving open a small area from the superior to inferior pole of the patella to serve as a mini lateral release. Flexion against gravity is 140  degrees and the patella tracks normally. Subcutaneous tissue is closed with 2.0 vicryl and subcuticular with running 4.0 Monocryl.The incision is cleaned and dried and steri-strips and a bulky sterile dressing are applied. The limb is placed into a knee immobilizer and the patient is awakened and transported to recovery in stable condition.      Please note that a surgical assistant was a medical necessity for this procedure in order to perform it in a safe and expeditious manner. Surgical assistant was necessary to retract the ligaments and vital neurovascular structures  to prevent injury to them and also necessary for proper positioning of the limb to allow for anatomic placement of the prosthesis.    Dione Plover Lya Holben, MD    12/09/2014, 10:12 AM

## 2014-12-09 NOTE — Plan of Care (Signed)
Problem: Consults Goal: Diagnosis- Total Joint Replacement Primary Total Knee     

## 2014-12-09 NOTE — Evaluation (Addendum)
Physical Therapy Evaluation Patient Details Name: Anne Lang MRN: [id number] DOB: Jan 21, 1951 Today's Date: 12/09/2014   History of Present Illness  L TKA  Clinical Impression  *Pt is s/p TKA resulting in the deficits listed below (see PT Problem List). * Pt will benefit from skilled PT to increase their independence and safety with mobility to allow discharge to the venue listed below.   Pt ambulated 10' with RW and min/guard assist, distance limited by pain. Initiated TKA exercises. Good progress expected.  **    Follow Up Recommendations Home health PT    Equipment Recommendations  None recommended by PT    Recommendations for Other Services OT consult     Precautions / Restrictions Precautions Precautions: Knee Restrictions Weight Bearing Restrictions: No Other Position/Activity Restrictions: WBAT  L knee immobilizer until SLR     Mobility  Bed Mobility Overal bed mobility: Needs Assistance Bed Mobility: Supine to Sit     Supine to sit: Min assist     General bed mobility comments: assist to support LLE, cues for technique  Transfers Overall transfer level: Needs assistance Equipment used: Rolling walker (2 wheeled) Transfers: Sit to/from Stand Sit to Stand: Mod assist         General transfer comment: assist to rise, cues for hand placement  Ambulation/Gait Ambulation/Gait assistance: Min assist Ambulation Distance (Feet): 10 Feet Assistive device: Rolling walker (2 wheeled) Gait Pattern/deviations: Step-to pattern;Decreased step length - left;Decreased stride length   Gait velocity interpretation: Below normal speed for age/gender General Gait Details: cues for sequencing, distance limited by pain  Stairs            Wheelchair Mobility    Modified Rankin (Stroke Patients Only)       Balance                                             Pertinent Vitals/Pain Pain Assessment: 0-10 Pain Score: 7  Pain Descriptors /  Indicators: Sore Pain Intervention(s): Limited activity within patient's tolerance;Monitored during session;Premedicated before session;Ice applied    Home Living Family/patient expects to be discharged to:: Private residence Living Arrangements: Children Available Help at Discharge: Available 24 hours/day Type of Home: House Home Access: Stairs to enter   [employer] of Steps: 1 Home Layout: One level Home Equipment: Environmental consultant - 2 wheels;Shower seat Additional Comments: handicapped height commode    Prior Function Level of Independence: Independent               Hand Dominance        Extremity/Trunk Assessment               Lower Extremity Assessment: LLE deficits/detail   LLE Deficits / Details: SLR 2/5, knee flexion 40* AAROM, ankle WNL  Cervical / Trunk Assessment: Normal  Communication   Communication: No difficulties  Cognition Arousal/Alertness: Awake/alert Behavior During Therapy: WFL for tasks assessed/performed Overall Cognitive Status: Within Functional Limits for tasks assessed                      General Comments      Exercises Total Joint Exercises Ankle Circles/Pumps: AROM;Both;10 reps;Supine Quad Sets: AROM;Left;5 reps;Supine Heel Slides: AAROM;Left;10 reps;Supine Goniometric ROM: knee flexion AAROM 5-40*      Assessment/Plan    PT Assessment Patient needs continued PT services  PT Diagnosis Difficulty walking;Acute pain   PT  Problem List Decreased strength;Decreased range of motion;Pain;Decreased knowledge of use of DME;Decreased mobility  PT Treatment Interventions DME instruction;Gait training;Stair training;Functional mobility training;Therapeutic activities;Patient/family education;Therapeutic exercise   PT Goals (Current goals can be found in the Care Plan section) Acute Rehab PT Goals Patient Stated Goal: to walk PT Goal Formulation: With patient/family Time For Goal Achievement: 12/23/14 Potential to  Achieve Goals: Good    Frequency 7X/week   Barriers to discharge        Co-evaluation               End of Session Equipment Utilized During Treatment: Gait belt Activity Tolerance: Patient limited by pain Patient left: in chair;with call bell/phone within reach;with family/visitor present           Time: 8921-1941 PT Time Calculation (min) (ACUTE ONLY): 33 min   Charges:   PT Evaluation $Initial PT Evaluation Tier I: 1 Procedure PT Treatments $Gait Training: 8-22 mins   PT G Codes:        Philomena Doheny 12/09/2014, 5:13 PM [phone]

## 2014-12-09 NOTE — Transfer of Care (Signed)
Immediate Anesthesia Transfer of Care Note  Patient: Anne Lang  Procedure(s) Performed: Procedure(s): LEFT TOTAL KNEE ARTHROPLASTY (Left)  Patient Location: PACU  Anesthesia Type:Spinal  Level of Consciousness: awake, alert  and oriented  Airway & Oxygen Therapy: Patient Spontanous Breathing and Patient connected to face mask oxygen  Post-op Assessment: Report given to RN  Post vital signs: Reviewed and stable  Last Vitals:  Filed Vitals:   12/09/14 0633  BP: 133/67  Pulse: 76  Temp: 36.6 C  Resp: 16    Complications: No apparent anesthesia complications

## 2014-12-09 NOTE — Anesthesia Procedure Notes (Signed)
Spinal Patient location during procedure: OR Staffing Anesthesiologist: Lang Zingg Performed by: anesthesiologist  Preanesthetic Checklist Completed: patient identified, site marked, surgical consent, pre-op evaluation, timeout performed, IV checked, risks and benefits discussed and monitors and equipment checked Spinal Block Patient position: sitting Prep: Betadine Patient monitoring: heart rate, continuous pulse ox and blood pressure Approach: right paramedian Location: L4-5 Injection technique: single-shot Needle Needle type: Spinocan  Needle gauge: 22 G Needle length: 9 cm Additional Notes Expiration date of kit checked and confirmed. Patient tolerated procedure well, without complications.     

## 2014-12-09 NOTE — Interval H&P Note (Signed)
History and Physical Interval Note:  12/09/2014 8:16 AM  Anne Lang  has presented today for surgery, with the diagnosis of OA LEFT KNEE   The various methods of treatment have been discussed with the patient and family. After consideration of risks, benefits and other options for treatment, the patient has consented to  Procedure(s): LEFT TOTAL KNEE ARTHROPLASTY (Left) as a surgical intervention .  The patient's history has been reviewed, patient examined, no change in status, stable for surgery.  I have reviewed the patient's chart and labs.  Questions were answered to the patient's satisfaction.     [name]

## 2014-12-09 NOTE — Anesthesia Preprocedure Evaluation (Addendum)
Anesthesia Evaluation  Patient identified by MRN, date of birth, ID band Patient awake    Reviewed: Allergy & Precautions, NPO status , Patient's Chart, lab work & pertinent test results  Airway Mallampati: II  TM Distance: >3 FB Neck ROM: Full    Dental no notable dental hx.    Pulmonary neg pulmonary ROS,  breath sounds clear to auscultation  Pulmonary exam normal       Cardiovascular hypertension, Pt. on medications negative cardio ROS  Rhythm:Regular Rate:Normal     Neuro/Psych negative neurological ROS  negative psych ROS   GI/Hepatic negative GI ROS, Neg liver ROS,   Endo/Other  negative endocrine ROSdiabetes, Type 2, Oral Hypoglycemic Agents  Renal/GU negative Renal ROS  negative genitourinary   Musculoskeletal negative musculoskeletal ROS (+)   Abdominal   Peds negative pediatric ROS (+)  Hematology negative hematology ROS (+)   Anesthesia Other Findings   Reproductive/Obstetrics negative OB ROS                            Anesthesia Physical Anesthesia Plan  ASA: II  Anesthesia Plan: Spinal   Post-op Pain Management:    Induction:   Airway Management Planned: Simple Face Mask  Additional Equipment:   Intra-op Plan:   Post-operative Plan:   Informed Consent: I have reviewed the patients History and Physical, chart, labs and discussed the procedure including the risks, benefits and alternatives for the proposed anesthesia with the patient or authorized representative who has indicated his/her understanding and acceptance.   Dental advisory given  Plan Discussed with: CRNA  Anesthesia Plan Comments:         Anesthesia Quick Evaluation

## 2014-12-09 NOTE — Progress Notes (Signed)
Utilization review completed.  

## 2014-12-10 ENCOUNTER — Encounter (HOSPITAL_COMMUNITY): Payer: Self-pay | Admitting: Orthopedic Surgery

## 2014-12-10 LAB — GLUCOSE, CAPILLARY
GLUCOSE-CAPILLARY: 156 mg/dL — AB (ref 70–99)
GLUCOSE-CAPILLARY: 205 mg/dL — AB (ref 70–99)
Glucose-Capillary: 256 mg/dL — ABNORMAL HIGH (ref 70–99)
Glucose-Capillary: 224 mg/dL — ABNORMAL HIGH (ref 70–99)

## 2014-12-10 LAB — CBC
HCT: 36.2 % (ref 36.0–46.0)
Hemoglobin: 11 g/dL — ABNORMAL LOW (ref 12.0–15.0)
MCH: 26.3 pg (ref 26.0–34.0)
MCHC: 30.4 g/dL (ref 30.0–36.0)
MCV: 86.6 fL (ref 78.0–100.0)
Platelets: 172 10*3/uL (ref 150–400)
RBC: 4.18 MIL/uL (ref 3.87–5.11)
RDW: 15.8 % — ABNORMAL HIGH (ref 11.5–15.5)
WBC: 10.5 10*3/uL (ref 4.0–10.5)

## 2014-12-10 LAB — BASIC METABOLIC PANEL
Anion gap: 10 (ref 5–15)
BUN: 14 mg/dL (ref 6–23)
CALCIUM: 8.8 mg/dL (ref 8.4–10.5)
CO2: 29 mmol/L (ref 19–32)
Chloride: 98 mmol/L (ref 96–112)
Creatinine, Ser: 0.92 mg/dL (ref 0.50–1.10)
GFR calc non Af Amer: 64 mL/min — ABNORMAL LOW (ref >90)
GFR, EST AFRICAN AMERICAN: 75 mL/min — AB (ref >90)
Glucose, Bld: 157 mg/dL — ABNORMAL HIGH (ref 70–99)
Potassium: 3.7 mmol/L (ref 3.5–5.1)
SODIUM: 137 mmol/L (ref 135–145)

## 2014-12-10 MED ORDER — TRAMADOL HCL 50 MG PO TABS: 50.0000 mg | ORAL_TABLET | Freq: Four times a day (QID) | ORAL | Status: DC | PRN

## 2014-12-10 MED ORDER — RIVAROXABAN 10 MG PO TABS: 10.0000 mg | ORAL_TABLET | Freq: Every day | ORAL | Status: DC

## 2014-12-10 MED ORDER — METHOCARBAMOL 500 MG PO TABS: 500.0000 mg | ORAL_TABLET | Freq: Four times a day (QID) | ORAL | Status: DC | PRN

## 2014-12-10 MED ORDER — OXYCODONE HCL 5 MG PO TABS: 5.0000 mg | ORAL_TABLET | ORAL | Status: DC | PRN

## 2014-12-10 NOTE — Evaluation (Addendum)
Occupational Therapy Evaluation Patient Details Name: Neriyah Cercone MRN: [id number] DOB: 09/07/1951 Today's Date: 12/10/2014    History of Present Illness L TKA   Clinical Impression   Pt doing well. Practiced up to toilet with walker. Will benefit from 3in1 for bilateral UE support. Will follow to progress safety and independence with self care tasks.    Follow Up Recommendations  No OT follow up;Supervision/Assistance - 24 hour    Equipment Recommendations  3 in 1 bedside comode    Recommendations for Other Services       Precautions / Restrictions Precautions Precautions: Knee Required Braces or Orthoses: Knee Immobilizer - Left Knee Immobilizer - Left: Discontinue once straight leg raise with < 10 degree lag Restrictions Weight Bearing Restrictions: No Other Position/Activity Restrictions: WBAT      Mobility Bed Mobility               General bed mobility comments: in chair  Transfers Overall transfer level: Needs assistance Equipment used: Rolling walker (2 wheeled) Transfers: Sit to/from Stand Sit to Stand: Min assist         General transfer comment: verbal cues hand placement and LE management. min assist to rise and steady.  mod assist to descend to toilet. min assist descend to recliner.     Balance                                            ADL Overall ADL's : Needs assistance/impaired Eating/Feeding: Independent;Sitting   Grooming: Wash/dry hands;Set up;Sitting   Upper Body Bathing: Set up;Sitting   Lower Body Bathing: Moderate assistance;Sit to/from stand   Upper Body Dressing : Set up;Sitting   Lower Body Dressing: Moderate assistance;Sit to/from stand   Toilet Transfer: Ambulation;BSC;RW;Moderate Print production planner Details (indicate cue type and reason): min assist to rise from commode but mod assist to descend to comfort height commode with bar.  Toileting- Clothing Manipulation and Hygiene: Minimal  assistance;Sit to/from stand         General ADL Comments: Pt states she has her daughter and also some other family helping her at home. She will benefit from 3in1 to have bilateral UE support as she had difficulty descending and ascending from comfort height commode with only grab bar. educated on uses of 3in1 as shower chair and BSC. Also educated on KI use until SLR.      Vision                     Perception     Praxis      Pertinent Vitals/Pain Pain Assessment: 0-10 Pain Score: 6  Pain Location: L knee Pain Descriptors / Indicators: Aching Pain Intervention(s): Repositioned;Ice applied     Hand Dominance     Extremity/Trunk Assessment Upper Extremity Assessment Upper Extremity Assessment: Overall WFL for tasks assessed           Communication Communication Communication: No difficulties   Cognition Arousal/Alertness: Awake/alert Behavior During Therapy: WFL for tasks assessed/performed Overall Cognitive Status: Within Functional Limits for tasks assessed                     General Comments       Exercises       Shoulder Instructions      Home Living Family/patient expects to be discharged to:: Private residence Living Arrangements: Children Available Help at  Discharge: Available 24 hours/day Type of Home: House Home Access: Stairs to enter [employer] of Steps: 1   Home Layout: One level     Bathroom Shower/Tub: Occupational psychologist: Handicapped height     Home Equipment: Environmental consultant - 2 wheels;Shower seat   Additional Comments: handicapped height commode      Prior Functioning/Environment Level of Independence: Independent             OT Diagnosis: Generalized weakness   OT Problem List: Decreased strength;Decreased knowledge of use of DME or AE   OT Treatment/Interventions: Self-care/ADL training;Patient/family education;Therapeutic activities;DME and/or AE instruction    OT Goals(Current  goals can be found in the care plan section) Acute Rehab OT Goals Patient Stated Goal: become more independent OT Goal Formulation: With patient Time For Goal Achievement: 12/17/14 Potential to Achieve Goals: Good ADL Goals Pt Will Transfer to Toilet: with min guard assist;ambulating;bedside commode Pt Will Perform Toileting - Clothing Manipulation and hygiene: with min guard assist;sit to/from stand Pt Will Perform Tub/Shower Transfer: with min assist;Shower transfer;3 in 1;rolling walker Additional ADL Goal #1: Pt will verbalize versus demo independence with AE for ADL  OT Frequency: Min 2X/week   Barriers to D/C:            Co-evaluation              End of Session Equipment Utilized During Treatment: Gait belt;Rolling walker;Left knee immobilizer CPM Left Knee CPM Left Knee: Off  Activity Tolerance: Patient tolerated treatment well Patient left: in chair;with call bell/phone within reach   Time: 5498-2641 OT Time Calculation (min): 30 min Charges:  OT General Charges $OT Visit: 1 Procedure OT Evaluation $Initial OT Evaluation Tier I: 1 Procedure OT Treatments $Therapeutic Activity: 8-22 mins G-Codes:    Jules Schick  [phone] 12/10/2014, 11:25 AM

## 2014-12-10 NOTE — Progress Notes (Signed)
   Subjective: 1 Day Post-Op Procedure(s) (LRB): LEFT TOTAL KNEE ARTHROPLASTY (Left) Patient reports pain as mild.   Patient seen in rounds with Dr. Wynelle Link. Patient is well, but has had some minor complaints of pain in the knee, requiring pain medications We will start therapy today.  Plan is to go Home after hospital stay.  Objective: Vital signs in last 24 hours: Temp:  [97.5 F (36.4 C)-98.8 F (37.1 C)] 98.5 F (36.9 C) (02/02 0625) Pulse Rate:  [50-75] 70 (02/02 0625) Resp:  [10-16] 16 (02/02 0732) BP: (100-141)/(55-79) 123/59 mmHg (02/02 0625) SpO2:  [50 %-100 %] 98 % (02/02 0732)  Intake/Output from previous day:  Intake/Output Summary (Last 24 hours) at 12/10/14 0750 Last data filed at 12/10/14 0641  Gross per 24 hour  Intake 5411.25 ml  Output   3800 ml  Net 1611.25 ml    Intake/Output this shift: UOP 1700  Labs:  Recent Labs  12/10/14 0500  HGB 11.0*    Recent Labs  12/10/14 0500  WBC 10.5  RBC 4.18  HCT 36.2  PLT 172    Recent Labs  12/10/14 0500  NA 137  K 3.7  CL 98  CO2 29  BUN 14  CREATININE 0.92  GLUCOSE 157*  CALCIUM 8.8   No results for input(s): LABPT, INR in the last 72 hours.  EXAM General - Patient is Alert, Appropriate and Oriented Extremity - Neurovascular intact Sensation intact distally Dorsiflexion/Plantar flexion intact Dressing - dressing C/D/I Motor Function - intact, moving foot and toes well on exam.  Hemovac pulled without difficulty.  Past Medical History  Diagnosis Date  . Hypertension   . Diabetes mellitus without complication   . Hypothyroidism   . Arthritis   . Impingement syndrome of right shoulder   . Hemorrhoids     Assessment/Plan: 1 Day Post-Op Procedure(s) (LRB): LEFT TOTAL KNEE ARTHROPLASTY (Left) Principal Problem:   OA (osteoarthritis) of knee  Estimated body mass index is 38.16 kg/(m^2) as calculated from the following:   Height as of this encounter: 5' 6.5" (1.689 m).   Weight  as of this encounter: 108.863 kg (240 lb). Advance diet Up with therapy Plan for discharge tomorrow Discharge home with home health  DVT Prophylaxis - Xarelto Weight-Bearing as tolerated to left leg D/C O2 and Pulse OX and try on Room Air  [name], PA-C Orthopaedic Surgery 12/10/2014, 7:50 AM

## 2014-12-10 NOTE — Discharge Summary (Signed)
Physician Discharge Summary   Patient ID: Anne Lang MRN: [id number] DOB/AGE: 1951/02/04 64 y.o.  Admit date: 12/09/2014 Discharge date: 12/11/2014  Primary Diagnosis:  Osteoarthritis Left knee(s) Admission Diagnoses:  Past Medical History  Diagnosis Date  . Hypertension   . Diabetes mellitus without complication   . Hypothyroidism   . Arthritis   . Impingement syndrome of right shoulder   . Hemorrhoids    Discharge Diagnoses:   Principal Problem:   OA (osteoarthritis) of knee  Estimated body mass index is 38.16 kg/(m^2) as calculated from the following:   Height as of this encounter: 5' 6.5" (1.689 m).   Weight as of this encounter: 108.863 kg (240 lb).  Procedure:  Procedure(s) (LRB): LEFT TOTAL KNEE ARTHROPLASTY (Left)   Consults: None  HPI: Anne Lang is a 64 y.o. year old female with end stage OA of her left knee with progressively worsening pain and dysfunction. She has constant pain, with activity and at rest and significant functional deficits with difficulties even with ADLs. She has had extensive non-op management including analgesics, injections of cortisone and viscosupplements, and home exercise program, but remains in significant pain with significant dysfunction. Radiographs show bone on bone arthritis lateral and patellofemoral with valgus deformity. She presents now for left Total Knee Arthroplasty.  Laboratory Data: Admission on 12/09/2014, Discharged on 12/11/2014  Component Date Value Ref Range Status  . Glucose-Capillary 12/09/2014 181* 70 - 99 mg/dL Final  . Glucose-Capillary 12/09/2014 150* 70 - 99 mg/dL Final  . Glucose-Capillary 12/09/2014 157* 70 - 99 mg/dL Final  . WBC 12/10/2014 10.5  4.0 - 10.5 K/uL Final  . RBC 12/10/2014 4.18  3.87 - 5.11 MIL/uL Final  . Hemoglobin 12/10/2014 11.0* 12.0 - 15.0 g/dL Final  . HCT 12/10/2014 36.2  36.0 - 46.0 % Final  . MCV 12/10/2014 86.6  78.0 - 100.0 fL Final  . MCH 12/10/2014 26.3  26.0 - 34.0 pg Final  .  MCHC 12/10/2014 30.4  30.0 - 36.0 g/dL Final  . RDW 12/10/2014 15.8* 11.5 - 15.5 % Final  . Platelets 12/10/2014 172  150 - 400 K/uL Final  . Sodium 12/10/2014 137  135 - 145 mmol/L Final  . Potassium 12/10/2014 3.7  3.5 - 5.1 mmol/L Final  . Chloride 12/10/2014 98  96 - 112 mmol/L Final  . CO2 12/10/2014 29  19 - 32 mmol/L Final  . Glucose, Bld 12/10/2014 157* 70 - 99 mg/dL Final  . BUN 12/10/2014 14  6 - 23 mg/dL Final  . Creatinine, Ser 12/10/2014 0.92  0.50 - 1.10 mg/dL Final  . Calcium 12/10/2014 8.8  8.4 - 10.5 mg/dL Final  . GFR calc non Af Amer 12/10/2014 64* >90 mL/min Final  . GFR calc Af Amer 12/10/2014 75* >90 mL/min Final   Comment: (NOTE) The eGFR has been calculated using the CKD EPI equation. This calculation has not been validated in all clinical situations. eGFR's persistently <90 mL/min signify possible Chronic Kidney Disease.   . Anion gap 12/10/2014 10  5 - 15 Final  . Glucose-Capillary 12/09/2014 212* 70 - 99 mg/dL Final  . Glucose-Capillary 12/09/2014 151* 70 - 99 mg/dL Final  . Glucose-Capillary 12/10/2014 156* 70 - 99 mg/dL Final  . Glucose-Capillary 12/10/2014 205* 70 - 99 mg/dL Final  . WBC 12/11/2014 10.7* 4.0 - 10.5 K/uL Final  . RBC 12/11/2014 4.08  3.87 - 5.11 MIL/uL Final  . Hemoglobin 12/11/2014 10.9* 12.0 - 15.0 g/dL Final  . HCT 12/11/2014 35.4* 36.0 - 46.0 %  Final  . MCV 12/11/2014 86.8  78.0 - 100.0 fL Final  . MCH 12/11/2014 26.7  26.0 - 34.0 pg Final  . MCHC 12/11/2014 30.8  30.0 - 36.0 g/dL Final  . RDW 12/11/2014 16.2* 11.5 - 15.5 % Final  . Platelets 12/11/2014 175  150 - 400 K/uL Final  . Sodium 12/11/2014 138  135 - 145 mmol/L Final  . Potassium 12/11/2014 3.7  3.5 - 5.1 mmol/L Final  . Chloride 12/11/2014 104  96 - 112 mmol/L Final  . CO2 12/11/2014 28  19 - 32 mmol/L Final  . Glucose, Bld 12/11/2014 167* 70 - 99 mg/dL Final  . BUN 12/11/2014 16  6 - 23 mg/dL Final  . Creatinine, Ser 12/11/2014 0.73  0.50 - 1.10 mg/dL Final  .  Calcium 12/11/2014 9.0  8.4 - 10.5 mg/dL Final  . GFR calc non Af Amer 12/11/2014 88* >90 mL/min Final  . GFR calc Af Amer 12/11/2014 >90  >90 mL/min Final   Comment: (NOTE) The eGFR has been calculated using the CKD EPI equation. This calculation has not been validated in all clinical situations. eGFR's persistently <90 mL/min signify possible Chronic Kidney Disease.   . Anion gap 12/11/2014 6  5 - 15 Final  . Glucose-Capillary 12/10/2014 256* 70 - 99 mg/dL Final  . Glucose-Capillary 12/10/2014 224* 70 - 99 mg/dL Final  . Glucose-Capillary 12/11/2014 172* 70 - 99 mg/dL Final  Hospital Outpatient Visit on 12/02/2014  Component Date Value Ref Range Status  . MRSA, PCR 12/02/2014 NEGATIVE  NEGATIVE Final  . Staphylococcus aureus 12/02/2014 POSITIVE* NEGATIVE Final   Comment:        The Xpert SA Assay (FDA approved for NASAL specimens in patients over 23 years of age), is one component of a comprehensive surveillance program.  Test performance has been validated by [facility] for patients greater than or equal to 66 year old. It is not intended to diagnose infection nor to guide or monitor treatment.   Marland Kitchen aPTT 12/02/2014 32  24 - 37 seconds Final  . WBC 12/02/2014 9.9  4.0 - 10.5 K/uL Final  . RBC 12/02/2014 5.08  3.87 - 5.11 MIL/uL Final  . Hemoglobin 12/02/2014 13.6  12.0 - 15.0 g/dL Final  . HCT 12/02/2014 43.9  36.0 - 46.0 % Final  . MCV 12/02/2014 86.4  78.0 - 100.0 fL Final  . MCH 12/02/2014 26.8  26.0 - 34.0 pg Final  . MCHC 12/02/2014 31.0  30.0 - 36.0 g/dL Final  . RDW 12/02/2014 16.2* 11.5 - 15.5 % Final  . Platelets 12/02/2014 182  150 - 400 K/uL Final  . Sodium 12/02/2014 139  135 - 145 mmol/L Final  . Potassium 12/02/2014 3.9  3.5 - 5.1 mmol/L Final  . Chloride 12/02/2014 100  96 - 112 mmol/L Final  . CO2 12/02/2014 30  19 - 32 mmol/L Final  . Glucose, Bld 12/02/2014 167* 70 - 99 mg/dL Final  . BUN 12/02/2014 13  6 - 23 mg/dL Final  . Creatinine, Ser 12/02/2014  0.80  0.50 - 1.10 mg/dL Final  . Calcium 12/02/2014 9.8  8.4 - 10.5 mg/dL Final  . Total Protein 12/02/2014 6.9  6.0 - 8.3 g/dL Final  . Albumin 12/02/2014 4.0  3.5 - 5.2 g/dL Final  . AST 12/02/2014 29  0 - 37 U/L Final  . ALT 12/02/2014 33  0 - 35 U/L Final  . Alkaline Phosphatase 12/02/2014 80  39 - 117 U/L Final  . Total  Bilirubin 12/02/2014 0.4  0.3 - 1.2 mg/dL Final  . GFR calc non Af Amer 12/02/2014 76* >90 mL/min Final  . GFR calc Af Amer 12/02/2014 88* >90 mL/min Final   Comment: (NOTE) The eGFR has been calculated using the CKD EPI equation. This calculation has not been validated in all clinical situations. eGFR's persistently <90 mL/min signify possible Chronic Kidney Disease.   . Anion gap 12/02/2014 9  5 - 15 Final  . Prothrombin Time 12/02/2014 13.1  11.6 - 15.2 seconds Final  . INR 12/02/2014 0.98  0.00 - 1.49 Final  . ABO/RH(D) 12/02/2014 A POS   Final  . Antibody Screen 12/02/2014 NEG   Final  . Sample Expiration 12/02/2014 12/12/2014   Final  . Color, Urine 12/02/2014 YELLOW  YELLOW Final  . APPearance 12/02/2014 CLEAR  CLEAR Final  . Specific Gravity, Urine 12/02/2014 1.028  1.005 - 1.030 Final  . pH 12/02/2014 5.5  5.0 - 8.0 Final  . Glucose, UA 12/02/2014 >1000* NEGATIVE mg/dL Final  . Hgb urine dipstick 12/02/2014 NEGATIVE  NEGATIVE Final  . Bilirubin Urine 12/02/2014 NEGATIVE  NEGATIVE Final  . Ketones, ur 12/02/2014 NEGATIVE  NEGATIVE mg/dL Final  . Protein, ur 12/02/2014 NEGATIVE  NEGATIVE mg/dL Final  . Urobilinogen, UA 12/02/2014 0.2  0.0 - 1.0 mg/dL Final  . Nitrite 12/02/2014 NEGATIVE  NEGATIVE Final  . Leukocytes, UA 12/02/2014 NEGATIVE  NEGATIVE Final  . Squamous Epithelial / LPF 12/02/2014 RARE  RARE Final  . WBC, UA 12/02/2014 0-2  <3 WBC/hpf Final  . ABO/RH(D) 12/02/2014 A POS   Final     X-Rays:Dg Chest 2 View  12/02/2014   EXAM: CHEST  2 VIEW  COMPARISON:  None.  FINDINGS: Mediastinum hilar structures normal. Lungs are clear. Heart size  normal. No pleural effusion or pneumothorax. Thoracic spine degenerative change.  IMPRESSION: No active cardiopulmonary disease.   Electronically Signed   By: Marcello Moores  Register   On: 12/02/2014 09:28   Mm Screening Breast Tomo Bilateral  11/19/2014   CLINICAL DATA:  Screening.  EXAM: DIGITAL SCREENING BILATERAL MAMMOGRAM WITH 3D TOMO WITH CAD  COMPARISON:  Previous exam(s).  ACR Breast Density Category b: There are scattered areas of fibroglandular density.  FINDINGS: There are no findings suspicious for malignancy. Images were processed with CAD.  IMPRESSION: No mammographic evidence of malignancy. A result letter of this screening mammogram will be mailed directly to the patient.  RECOMMENDATION: Screening mammogram in one year. (Code:[id number])  BI-RADS CATEGORY  1: Negative.   Electronically Signed   By: Shon Hale M.D.   On: 11/19/2014 15:31    EKG: Orders placed or performed during the hospital encounter of 12/02/14  . EKG 12-Lead  . EKG 12-Lead     Hospital Course: Anne Lang is a 64 y.o. who was admitted to [facility]. They were brought to the operating room on 12/09/2014 and underwent Procedure(s): LEFT TOTAL KNEE ARTHROPLASTY.  Patient tolerated the procedure well and was later transferred to the recovery room and then to the orthopaedic floor for postoperative care.  They were given PO and IV analgesics for pain control following their surgery.  They were given 24 hours of postoperative antibiotics of  Anti-infectives    Start     Dose/Rate Route Frequency Ordered Stop   12/09/14 1500  ceFAZolin (ANCEF) IVPB 2 g/50 mL premix     2 g100 mL/hr over 30 Minutes Intravenous Every 6 hours 12/09/14 1215 12/09/14 2117   12/09/14 0701  ceFAZolin (  ANCEF) IVPB 2 g/50 mL premix     2 g100 mL/hr over 30 Minutes Intravenous On call to O.R. 12/09/14 0701 12/09/14 0917     and started on DVT prophylaxis in the form of Xarelto.   PT and OT were ordered for total joint protocol.  Discharge  planning consulted to help with postop disposition and equipment needs.  Patient had a decent night on the evening of surgery.  They started to get up OOB with therapy on day one. Hemovac drain was pulled without difficulty.  Continued to work with therapy into day two.  Dressing was changed on day two and the incision was healing well. Patient was seen in rounds and was ready to go home.  Discharge home with home health Diet - Cardiac diet and Diabetic diet Follow up - in 2 weeks Activity - WBAT Disposition - Home Condition Upon Discharge - Good D/C Meds - See DC Summary DVT Prophylaxis - Xarelto      Discharge Instructions    Call MD / Call 911    Complete by:  As directed   If you experience chest pain or shortness of breath, CALL 911 and be transported to the hospital emergency room.  If you develope a fever above 101 F, pus (white drainage) or increased drainage or redness at the wound, or calf pain, call your surgeon's office.     Change dressing    Complete by:  As directed   Change dressing daily with sterile 4 x 4 inch gauze dressing and apply TED hose. Do not submerge the incision under water.     Constipation Prevention    Complete by:  As directed   Drink plenty of fluids.  Prune juice may be helpful.  You may use a stool softener, such as Colace (over the counter) 100 mg twice a day.  Use MiraLax (over the counter) for constipation as needed.     Diet - low sodium heart healthy    Complete by:  As directed      Diet Carb Modified    Complete by:  As directed      Discharge instructions    Complete by:  As directed   Pick up stool softner and laxative for home use following surgery while on pain medications. Do not submerge incision under water. Please use good hand washing techniques while changing dressing each day. May shower starting three days after surgery. Please use a clean towel to pat the incision dry following showers. Continue to use ice for pain and swelling  after surgery. Do not use any lotions or creams on the incision until instructed by your surgeon.  Take Xarelto for two and a half more weeks, then discontinue Xarelto. Once the patient has completed the Xarelto, they may resume the 81 mg Aspirin.  Postoperative Constipation Protocol  Constipation - defined medically as fewer than three stools per week and severe constipation as less than one stool per week.  One of the most common issues patients have following surgery is constipation.  Even if you have a regular bowel pattern at home, your normal regimen is likely to be disrupted due to multiple reasons following surgery.  Combination of anesthesia, postoperative narcotics, change in appetite and fluid intake all can affect your bowels.  In order to avoid complications following surgery, here are some recommendations in order to help you during your recovery period.  Colace (docusate) - Pick up an over-the-counter form of Colace or another stool softener  and take twice a day as long as you are requiring postoperative pain medications.  Take with a full glass of water daily.  If you experience loose stools or diarrhea, hold the colace until you stool forms back up.  If your symptoms do not get better within 1 week or if they get worse, check with your doctor.  Dulcolax (bisacodyl) - Pick up over-the-counter and take as directed by the product packaging as needed to assist with the movement of your bowels.  Take with a full glass of water.  Use this product as needed if not relieved by Colace only.   MiraLax (polyethylene glycol) - Pick up over-the-counter to have on hand.  MiraLax is a solution that will increase the amount of water in your bowels to assist with bowel movements.  Take as directed and can mix with a glass of water, juice, soda, coffee, or tea.  Take if you go more than two days without a movement. Do not use MiraLax more than once per day. Call your doctor if you are still  constipated or irregular after using this medication for 7 days in a row.  If you continue to have problems with postoperative constipation, please contact the office for further assistance and recommendations.  If you experience "the worst abdominal pain ever" or develop nausea or vomiting, please contact the office immediatly for further recommendations for treatment.     Do not put a pillow under the knee. Place it under the heel.    Complete by:  As directed      Do not sit on low chairs, stoools or toilet seats, as it may be difficult to get up from low surfaces    Complete by:  As directed      Driving restrictions    Complete by:  As directed   No driving until released by the physician.     Increase activity slowly as tolerated    Complete by:  As directed      Lifting restrictions    Complete by:  As directed   No lifting until released by the physician.     Patient may shower    Complete by:  As directed   You may shower without a dressing once there is no drainage.  Do not wash over the wound.  If drainage remains, do not shower until drainage stops.     TED hose    Complete by:  As directed   Use stockings (TED hose) for 3 weeks on both leg(s).  You may remove them at night for sleeping.     Weight bearing as tolerated    Complete by:  As directed   Laterality:  left  Extremity:  Lower            Medication List    STOP taking these medications        aspirin EC 81 MG tablet     CALCIUM + D PO     cholecalciferol 1000 UNITS tablet  Commonly known as:  VITAMIN D     meloxicam 15 MG tablet  Commonly known as:  MOBIC     metaxalone 800 MG tablet  Commonly known as:  SKELAXIN     multivitamin with minerals Tabs tablet     vitamin C 500 MG tablet  Commonly known as:  ASCORBIC ACID      TAKE these medications        acetaminophen 500 MG tablet  Commonly known as:  TYLENOL  Take 1,000 mg by mouth every 6 (six) hours as needed for mild pain, moderate pain  or headache.     atorvastatin 10 MG tablet  Commonly known as:  LIPITOR  Take 5 mg by mouth daily.     calcium carbonate 500 MG chewable tablet  Commonly known as:  TUMS - dosed in mg elemental calcium  Chew 1 tablet by mouth as needed for indigestion or heartburn.     empagliflozin 25 MG Tabs tablet  Commonly known as:  JARDIANCE  Take 25 mg by mouth daily.     ferrous sulfate 325 (65 FE) MG tablet  Take 325 mg by mouth daily with breakfast.     hydrochlorothiazide 25 MG tablet  Commonly known as:  HYDRODIURIL  Take 25 mg by mouth every morning.     JANUMET XR 50-1000 MG Tb24  Generic drug:  SitaGLIPtin-MetFORMIN HCl  Take 2 tablets by mouth every evening.     levothyroxine 150 MCG tablet  Commonly known as:  SYNTHROID, LEVOTHROID  Take 150 mcg by mouth daily before breakfast.     methocarbamol 500 MG tablet  Commonly known as:  ROBAXIN  Take 1 tablet (500 mg total) by mouth every 6 (six) hours as needed for muscle spasms.     oxyCODONE 5 MG immediate release tablet  Commonly known as:  Oxy IR/ROXICODONE  Take 1-2 tablets (5-10 mg total) by mouth every 3 (three) hours as needed for moderate pain, severe pain or breakthrough pain.     ramipril 10 MG capsule  Commonly known as:  ALTACE  Take 10 mg by mouth every morning.     rivaroxaban 10 MG Tabs tablet  Commonly known as:  XARELTO  - Take 1 tablet (10 mg total) by mouth daily with breakfast. Take Xarelto for two and a half more weeks, then discontinue Xarelto.  - Once the patient has completed the Xarelto, they may resume the 81 mg Aspirin.     traMADol 50 MG tablet  Commonly known as:  ULTRAM  Take 1-2 tablets (50-100 mg total) by mouth every 6 (six) hours as needed (mild pain).       Follow-up Information    Follow up with [facility].   Why:  home health physical and occupational therapy   Contact information:   [address] [place] [address] [phone]       Follow up with  [facility].   Why:  3n1 (commode)   Contact information:   [address] [place] [address] [phone]       Follow up with [name], MD. Schedule an appointment as soon as possible for a visit on 12/24/2014.   Specialty:  Orthopedic Surgery   Why:  Call ofcice at [phone] to set up follow with Dr. Denman George information:   [address] [address] [phone]       Signed: [name], PA-C Orthopaedic Surgery 12/16/2014, 7:51 AM

## 2014-12-10 NOTE — Progress Notes (Signed)
Physical Therapy Treatment Patient Details Name: Anne Lang MRN: [id number] DOB: October 12, 1951 Today's Date: 12/10/2014    History of Present Illness L TKA    PT Comments    POD # 1 am session.  Pt OOB in recliner.  Assisted with amb in hallway then to BR to void.  Assisted with TKR TE's and applied ICE.    Follow Up Recommendations  Home health PT     Equipment Recommendations  None recommended by PT    Recommendations for Other Services       Precautions / Restrictions Precautions Precautions: Knee Precaution Comments: instructed pt on KI use for amb Required Braces or Orthoses: Knee Immobilizer - Left Knee Immobilizer - Left: Discontinue once straight leg raise with < 10 degree lag Restrictions Weight Bearing Restrictions: No Other Position/Activity Restrictions: WBAT    Mobility  Bed Mobility               General bed mobility comments: Pt OOB in recliner  Transfers Overall transfer level: Needs assistance Equipment used: Rolling walker (2 wheeled) Transfers: Sit to/from Stand Sit to Stand: Min assist         General transfer comment: 50% VC's on proper tech and hand placement plus increased time  Ambulation/Gait Ambulation/Gait assistance: Min assist Ambulation Distance (Feet): 25 Feet Assistive device: Rolling walker (2 wheeled) Gait Pattern/deviations: Step-to pattern;Trunk flexed Gait velocity: decreased   General Gait Details: 50% VC's on proper sequencing, proper walker to self distance and safety with turns.   Stairs            Wheelchair Mobility    Modified Rankin (Stroke Patients Only)       Balance                                    Cognition                            Exercises   Total Knee Replacement TE's 10 reps B LE ankle pumps 10 reps towel squeezes 10 reps knee presses 10 reps heel slides  10 reps SAQ's 10 reps SLR's 10 reps ABD Followed by ICE     General Comments         Pertinent Vitals/Pain Pain Assessment: 0-10 Pain Score: 7  Pain Location: L knee Pain Descriptors / Indicators: Constant;Sore Pain Intervention(s): Monitored during session;Premedicated before session;Repositioned;Ice applied    Home Living                      Prior Function            PT Goals (current goals can now be found in the care plan section) Progress towards PT goals: Progressing toward goals    Frequency  7X/week    PT Plan      Co-evaluation             End of Session Equipment Utilized During Treatment: Gait belt;Left knee immobilizer Activity Tolerance: Patient limited by fatigue;Patient limited by pain Patient left: in chair;with call bell/phone within reach;with family/visitor present     Time: 2952-8413 PT Time Calculation (min) (ACUTE ONLY): 25 min  Charges:  $Gait Training: 8-22 mins $Therapeutic Exercise: 8-22 mins                    G Codes:      Anne Lang  Anne Lang  PTA [facility]  Acute  Rehab Pager      [phone]

## 2014-12-10 NOTE — Discharge Instructions (Addendum)
° °Dr. Frank Aluisio °Total Joint Specialist °[facility] °[address] °[place], [place] [address] °[phone] ° °TOTAL KNEE REPLACEMENT POSTOPERATIVE DIRECTIONS ° ° ° °Knee Rehabilitation, Guidelines Following Surgery  °Results after knee surgery are often greatly improved when you follow the exercise, range of motion and muscle strengthening exercises prescribed by your doctor. Safety measures are also important to protect the knee from further injury. Any time any of these exercises cause you to have increased pain or swelling in your knee joint, decrease the amount until you are comfortable again and slowly increase them. If you have problems or questions, call your caregiver or physical therapist for advice.  ° °HOME CARE INSTRUCTIONS  °Remove items at home which could result in a fall. This includes throw rugs or furniture in walking pathways.  °Continue medications as instructed at time of discharge. °You may have some home medications which will be placed on hold until you complete the course of blood thinner medication.  °You may start showering once you are discharged home but do not submerge the incision under water. Just pat the incision dry and apply a dry gauze dressing on daily. °Walk with walker as instructed.  °You may resume a sexual relationship in one month or when given the OK by  your doctor.  °· Use walker as long as suggested by your caregivers. °· Avoid periods of inactivity such as sitting longer than an hour when not asleep. This helps prevent blood clots.  °You may put full weight on your legs and walk as much as is comfortable.  °You may return to work once you are cleared by your doctor.  °Do not drive a car for 6 weeks or until released by you surgeon.  °· Do not drive while taking narcotics.  °Wear the elastic stockings for three weeks following surgery during the day but you may remove then at night. °Make sure you keep all of your appointments after your  operation with all of your doctors and caregivers. You should call the office at the above phone number and make an appointment for approximately two weeks after the date of your surgery. °Change the dressing daily and reapply a dry dressing each time. °Please pick up a stool softener and laxative for home use as long as you are requiring pain medications. °· ICE to the affected knee every three hours for 30 minutes at a time and then as needed for pain and swelling.  Continue to use ice on the knee for pain and swelling from surgery. You may notice swelling that will progress down to the foot and ankle.  This is normal after surgery.  Elevate the leg when you are not up walking on it.   °It is important for you to complete the blood thinner medication as prescribed by your doctor. °· Continue to use the breathing machine which will help keep your temperature down.  It is common for your temperature to cycle up and down following surgery, especially at night when you are not up moving around and exerting yourself.  The breathing machine keeps your lungs expanded and your temperature down. ° °RANGE OF MOTION AND STRENGTHENING EXERCISES  °Rehabilitation of the knee is important following a knee injury or an operation. After just a few days of immobilization, the muscles of the thigh which control the knee become weakened and shrink (atrophy). Knee exercises are designed to build up the tone and strength of the thigh muscles and to improve knee   motion. Often times heat used for twenty to thirty minutes before working out will loosen up your tissues and help with improving the range of motion but do not use heat for the first two weeks following surgery. These exercises can be done on a training (exercise) mat, on the floor, on a table or on a bed. Use what ever works the best and is most comfortable for you Knee exercises include:  °Leg Lifts - While your knee is still immobilized in a splint or cast, you can do  straight leg raises. Lift the leg to 60 degrees, hold for 3 sec, and slowly lower the leg. Repeat 10-20 times 2-3 times daily. Perform this exercise against resistance later as your knee gets better.  °Quad and Hamstring Sets - Tighten up the muscle on the front of the thigh (Quad) and hold for 5-10 sec. Repeat this 10-20 times hourly. Hamstring sets are done by pushing the foot backward against an object and holding for 5-10 sec. Repeat as with quad sets.  °A rehabilitation program following serious knee injuries can speed recovery and prevent re-injury in the future due to weakened muscles. Contact your doctor or a physical therapist for more information on knee rehabilitation.  ° °SKILLED REHAB INSTRUCTIONS: °If the patient is transferred to a skilled rehab facility following release from the hospital, a list of the current medications will be sent to the facility for the patient to continue.  When discharged from the skilled rehab facility, please have the facility set up the patient's Home Health Physical Therapy prior to being released. Also, the skilled facility will be responsible for providing the patient with their medications at time of release from the facility to include their pain medication, the muscle relaxants, and their blood thinner medication. If the patient is still at the rehab facility at time of the two week follow up appointment, the skilled rehab facility will also need to assist the patient in arranging follow up appointment in our office and any transportation needs. ° °MAKE SURE YOU:  °Understand these instructions.  °Will watch your condition.  °Will get help right away if you are not doing well or get worse.  ° ° °Pick up stool softner and laxative for home use following surgery while on pain medications. °Do not submerge incision under water. °Please use good hand washing techniques while changing dressing each day. °May shower starting three days after surgery. °Please use a clean  towel to pat the incision dry following showers. °Continue to use ice for pain and swelling after surgery. °Do not use any lotions or creams on the incision until instructed by your surgeon. ° °Take Xarelto for two and a half more weeks, then discontinue Xarelto. °Once the patient has completed the blood thinner regimen, then take a Baby 81 mg Aspirin daily for three more weeks. ° °Postoperative Constipation Protocol ° °Constipation - defined medically as fewer than three stools per week and severe constipation as less than one stool per week. ° °One of the most common issues patients have following surgery is constipation.  Even if you have a regular bowel pattern at home, your normal regimen is likely to be disrupted due to multiple reasons following surgery.  Combination of anesthesia, postoperative narcotics, change in appetite and fluid intake all can affect your bowels.  In order to avoid complications following surgery, here are some recommendations in order to help you during your recovery period. ° °Colace (docusate) - Pick up an over-the-counter form of   Colace or another stool softener and take twice a day as long as you are requiring postoperative pain medications.  Take with a full glass of water daily.  If you experience loose stools or diarrhea, hold the colace until you stool forms back up.  If your symptoms do not get better within 1 week or if they get worse, check with your doctor.  Dulcolax (bisacodyl) - Pick up over-the-counter and take as directed by the product packaging as needed to assist with the movement of your bowels.  Take with a full glass of water.  Use this product as needed if not relieved by Colace only.   MiraLax (polyethylene glycol) - Pick up over-the-counter to have on hand.  MiraLax is a solution that will increase the amount of water in your bowels to assist with bowel movements.  Take as directed and can mix with a glass of water, juice, soda, coffee, or tea.  Take if you  go more than two days without a movement. Do not use MiraLax more than once per day. Call your doctor if you are still constipated or irregular after using this medication for 7 days in a row.  If you continue to have problems with postoperative constipation, please contact the office for further assistance and recommendations.  If you experience "the worst abdominal pain ever" or develop nausea or vomiting, please contact the office immediatly for further recommendations for treatment.    Information on my medicine - XARELTO (Rivaroxaban)  This medication education was reviewed with me or my healthcare representative as part of my discharge preparation.  The pharmacist that spoke with me during my hospital stay was:  Minda Ditto, [facility]  Why was Xarelto prescribed for you? Xarelto was prescribed for you to reduce the risk of blood clots forming after orthopedic surgery. The medical term for these abnormal blood clots is venous thromboembolism (VTE).  What do you need to know about xarelto ? Take your Xarelto ONCE DAILY at the same time every day. You may take it either with or without food.  If you have difficulty swallowing the tablet whole, you may crush it and mix in applesauce just prior to taking your dose.  Take Xarelto exactly as prescribed by your doctor and DO NOT stop taking Xarelto without talking to the doctor who prescribed the medication.  Stopping without other VTE prevention medication to take the place of Xarelto may increase your risk of developing a clot.  After discharge, you should have regular check-up appointments with your healthcare provider that is prescribing your Xarelto.    What do you do if you miss a dose? If you miss a dose, take it as soon as you remember on the same day then continue your regularly scheduled once daily regimen the next day. Do not take two doses of Xarelto on the same day.   Important Safety Information A possible side effect  of Xarelto is bleeding. You should call your healthcare provider right away if you experience any of the following: ? Bleeding from an injury or your nose that does not stop. ? Unusual colored urine (red or dark brown) or unusual colored stools (red or black). ? Unusual bruising for unknown reasons. ? A serious fall or if you hit your head (even if there is no bleeding).  Some medicines may interact with Xarelto and might increase your risk of bleeding while on Xarelto. To help avoid this, consult your healthcare provider or pharmacist prior to using any  new prescription or non-prescription medications, including herbals, vitamins, non-steroidal anti-inflammatory drugs (NSAIDs) and supplements.  * On Aspirin and Mobic prior to admission. Discuss with MD when to resume **  This website has more information on Xarelto: [email or web address]

## 2014-12-10 NOTE — Progress Notes (Signed)
Physical Therapy Treatment Patient Details Name: Anne Lang MRN: [id number] DOB: [birth year] Today's Date: 12/10/2014    History of Present Illness L TKA    PT Comments    POD # 1 pm session.  Daughter present so educated on safe handling tranfers/amb pt.  Instructed on proper application of KI and use for amb until pt able to perform 10 active SLR.  Educated on proper positioning L LE in extension.  Educated on TKR TE's HEP and given handout.  Educated on use of ICE.   Follow Up Recommendations  Home health PT     Equipment Recommendations  None recommended by PT    Recommendations for Other Services       Precautions / Restrictions Precautions Precautions: Knee Precaution Comments: instructed pt on KI use for amb Required Braces or Orthoses: Knee Immobilizer - Left Knee Immobilizer - Left: Discontinue once straight leg raise with < 10 degree lag Restrictions Weight Bearing Restrictions: No Other Position/Activity Restrictions: WBAT    Mobility  Bed Mobility Overal bed mobility: Needs Assistance Bed Mobility: Sit to Supine     Supine to sit: Min assist     General bed mobility comments: min assist to support LE back onto bed  Transfers Overall transfer level: Needs assistance Equipment used: Rolling walker (2 wheeled) Transfers: Sit to/from Stand Sit to Stand: Min assist         General transfer comment: 50% VC's on proper tech and hand placement plus increased time  Ambulation/Gait Ambulation/Gait assistance: Min assist Ambulation Distance (Feet): 55 Feet Assistive device: Rolling walker (2 wheeled) Gait Pattern/deviations: Step-to pattern;Trunk flexed Gait velocity: decreased   General Gait Details: 50% VC's on proper sequencing, proper walker to self distance and safety with turns.   Stairs            Wheelchair Mobility    Modified Rankin (Stroke Patients Only)       Balance                                     Cognition                            Exercises   Total Knee Replacement TE's 10 reps B LE ankle pumps 10 reps towel squeezes 10 reps knee presses 10 reps heel slides  10 reps SAQ's 10 reps SLR's 10 reps ABD Followed by ICE     General Comments        Pertinent Vitals/Pain Pain Assessment: 0-10 Pain Score: 7  Pain Location: L knee Pain Descriptors / Indicators: Constant;Sore Pain Intervention(s): Monitored during session;Premedicated before session;Repositioned;Ice applied    Home Living                      Prior Function            PT Goals (current goals can now be found in the care plan section) Progress towards PT goals: Progressing toward goals    Frequency  7X/week    PT Plan      Co-evaluation             End of Session Equipment Utilized During Treatment: Gait belt;Left knee immobilizer Activity Tolerance: Patient limited by fatigue;Patient limited by pain Patient left: in chair;with call bell/phone within reach;with family/visitor present     Time: 6283-1517 PT Time Calculation (min) (ACUTE  ONLY): 40 min  Charges:  $Gait Training: 8-22 mins $Therapeutic Exercise: 8-22 mins $Therapeutic Activity: 8-22 mins                    G Codes:      Rica Koyanagi  PTA [facility]  Acute  Rehab Pager      [phone]

## 2014-12-11 LAB — CBC
HCT: 35.4 % — ABNORMAL LOW (ref 36.0–46.0)
HEMOGLOBIN: 10.9 g/dL — AB (ref 12.0–15.0)
MCH: 26.7 pg (ref 26.0–34.0)
MCHC: 30.8 g/dL (ref 30.0–36.0)
MCV: 86.8 fL (ref 78.0–100.0)
Platelets: 175 10*3/uL (ref 150–400)
RBC: 4.08 MIL/uL (ref 3.87–5.11)
RDW: 16.2 % — ABNORMAL HIGH (ref 11.5–15.5)
WBC: 10.7 10*3/uL — ABNORMAL HIGH (ref 4.0–10.5)

## 2014-12-11 LAB — BASIC METABOLIC PANEL
Anion gap: 6 (ref 5–15)
BUN: 16 mg/dL (ref 6–23)
CALCIUM: 9 mg/dL (ref 8.4–10.5)
CO2: 28 mmol/L (ref 19–32)
Chloride: 104 mmol/L (ref 96–112)
Creatinine, Ser: 0.73 mg/dL (ref 0.50–1.10)
GFR calc Af Amer: >90 mL/min (ref >90)
GFR calc non Af Amer: 88 mL/min — ABNORMAL LOW (ref >90)
Glucose, Bld: 167 mg/dL — ABNORMAL HIGH (ref 70–99)
POTASSIUM: 3.7 mmol/L (ref 3.5–5.1)
Sodium: 138 mmol/L (ref 135–145)

## 2014-12-11 LAB — GLUCOSE, CAPILLARY: GLUCOSE-CAPILLARY: 172 mg/dL — AB (ref 70–99)

## 2014-12-11 NOTE — Care Management Note (Addendum)
    Page 1 of 2   12/11/2014     11:35:24 AM CARE MANAGEMENT NOTE 12/11/2014  Patient:  Anne Lang, Anne Lang   Account Number:  [id number]  Date Initiated:  12/10/2014  Documentation initiated by:  [facility]  Subjective/Objective Assessment:   ZOX:WRUE TOTAL KNEE ARTHROPLASTY (Left)     Action/Plan:   discharge planning   Anticipated DC Date:  12/11/2014   Anticipated DC Plan:  [place]  CM consult      [facility] Choice  HOME HEALTH   Choice offered to / List presented to:  C-1 Patient   DME arranged  3-N-1      DME agency  [place] arranged  [place]   Status of service:  Completed, signed off Medicare Important Message given?   (If response is "NO", the following Medicare IM given date fields will be blank) Date Medicare IM given:   Medicare IM given by:   Date Additional Medicare IM given:   Additional Medicare IM given by:    Discharge Disposition:  [place]  Per UR Regulation:    If discussed at Long Length of Stay Meetings, dates discussed:    Comments:  12/11/14 Cm called [facility] rep, Tim to notify of add on for HHPT/OT to be rendered.  No other CM needs were communicated.  Mariane Masters, BSN, Cm [phone].  12/10/14 15:00 CM met with pt in room to offer choice of home health agency.  Pt chooses [facility] to render HHPT.  CM called [facility] DME rep, Lecretia to please deliver 3n1 to room prior to discharge.  Address and contact information verified by pt.  referral given to [employer], Tim.  No other CM needs were communicated. Mariane Masters, BSN, CM [phone].

## 2014-12-11 NOTE — Progress Notes (Signed)
   Subjective: 2 Days Post-Op Procedure(s) (LRB): LEFT TOTAL KNEE ARTHROPLASTY (Left) Patient reports pain as mild.   Patient seen in rounds with Dr. Wynelle Link. Patient is well, and has had no acute complaints or problems Patient is ready to go home today. Low grade temp. Encourage I.S.  Objective: Vital signs in last 24 hours: Temp:  [98 F (36.7 C)-100.2 F (37.9 C)] 100 F (37.8 C) (02/03 0500) Pulse Rate:  [68-85] 81 (02/03 0500) Resp:  [16-18] 16 (02/03 0500) BP: (135-142)/(57-70) 139/70 mmHg (02/03 0500) SpO2:  [94 %-98 %] 94 % (02/03 0500)  Intake/Output from previous day:  Intake/Output Summary (Last 24 hours) at 12/11/14 0719 Last data filed at 12/11/14 0530  Gross per 24 hour  Intake    751 ml  Output   2450 ml  Net  -1699 ml     Labs:  Recent Labs  12/10/14 0500 12/11/14 0518  HGB 11.0* 10.9*    Recent Labs  12/10/14 0500 12/11/14 0518  WBC 10.5 10.7*  RBC 4.18 4.08  HCT 36.2 35.4*  PLT 172 175    Recent Labs  12/10/14 0500 12/11/14 0518  NA 137 138  K 3.7 3.7  CL 98 104  CO2 29 28  BUN 14 16  CREATININE 0.92 0.73  GLUCOSE 157* 167*  CALCIUM 8.8 9.0   No results for input(s): LABPT, INR in the last 72 hours.  EXAM: General - Patient is Alert, Appropriate and Oriented Extremity - Neurovascular intact Sensation intact distally Dorsiflexion/Plantar flexion intact Incision - clean, dry, no drainage Motor Function - intact, moving foot and toes well on exam.   Assessment/Plan: 2 Days Post-Op Procedure(s) (LRB): LEFT TOTAL KNEE ARTHROPLASTY (Left) Procedure(s) (LRB): LEFT TOTAL KNEE ARTHROPLASTY (Left) Past Medical History  Diagnosis Date  . Hypertension   . Diabetes mellitus without complication   . Hypothyroidism   . Arthritis   . Impingement syndrome of right shoulder   . Hemorrhoids    Principal Problem:   OA (osteoarthritis) of knee  Estimated body mass index is 38.16 kg/(m^2) as calculated from the following:   Height  as of this encounter: 5' 6.5" (1.689 m).   Weight as of this encounter: 108.863 kg (240 lb). Up with therapy Discharge home with home health Diet - Cardiac diet and Diabetic diet Follow up - in 2 weeks Activity - WBAT Disposition - Home Condition Upon Discharge - Good D/C Meds - See DC Summary DVT Prophylaxis - Xarelto  [name], PA-C Orthopaedic Surgery 12/11/2014, 7:19 AM

## 2014-12-11 NOTE — Progress Notes (Signed)
Occupational Therapy Treatment Patient Details Name: Anne Lang MRN: [id number] DOB: 12-12-1950 Today's Date: 12/11/2014    History of present illness L TKA   OT comments  Pt's family present for education. Recommend HHOT to reinforce and practice ADL further . She needs some assist to steady with standing ADL and functional transfers. Educated family on how to provide steady assist.   Follow Up Recommendations  Home health OT;Supervision/Assistance - 24 hour    Equipment Recommendations  3 in 1 bedside comode    Recommendations for Other Services      Precautions / Restrictions Precautions Precautions: Knee Precaution Comments: instructed pt on KI use for amb Required Braces or Orthoses: Knee Immobilizer - Left Knee Immobilizer - Left: Discontinue once straight leg raise with < 10 degree lag Restrictions Weight Bearing Restrictions: No Other Position/Activity Restrictions: WBAT       Mobility Bed Mobility                  Transfers Overall transfer level: Needs assistance Equipment used: Rolling walker (2 wheeled) Transfers: Sit to/from Stand Sit to Stand: Min assist         General transfer comment: cues hand placement and LE management.    Balance                                   ADL                       Lower Body Dressing: Moderate assistance;Sitting/lateral leans;With adaptive equipment Lower Body Dressing Details (indicate cue type and reason): Pt's shoe are very flexible and difficulty getting foot in shoe without sides collapsing in. Her shoes also tended to grip the floor too much on the bottoom and would throw her slightly off with trying to lift foot off floor. Advised pt to maybe try her tennis shoes and see if they grip floor but not too much.  Toilet Transfer: Minimal assistance;Ambulation;BSC;RW   Toileting- Clothing Manipulation and Hygiene: Minimal assistance;Sit to/from stand         General ADL Comments:  Daughter showed OT pictures of pt's bathroom space and it is very tight and appears that there is not enough room for a 3in1 to go around commode. Discussd use as BSC until pt able to stand from her higher commmode without needing UE support safely. Also discussed that her shower stall space is narrow and she cant back up to shower with walker. Pt will sponge bathe initially until she can access her shower without needing walker (determine by HHPT) or until she can step over the side of the tub. She has a seat for tub if needed. Cautioned pt to not stand right now without KI already in place and educated on how to don/doff. Family present for all. Pt with one unsteady episode transfering into bathroom but tended to step too close to walker when this occurred. Educated on proper walker distance to self. Will need 3in1. Pt has all AE and educated on uses of all AE. Pt practiced with sock aid and did well. did well with reacher to doff sock also. Pt has tennis shoes with elastic laces.       Vision                     Quarry manager   Behavior  During Therapy: WFL for tasks assessed/performed Overall Cognitive Status: Within Functional Limits for tasks assessed                       Extremity/Trunk Assessment               Exercises     Shoulder Instructions       General Comments      Pertinent Vitals/ Pain       Pain Assessment: 0-10 Pain Score: 4  Pain Location: L knee Pain Descriptors / Indicators: Aching Pain Intervention(s): Repositioned;Ice applied  Home Living                                          Prior Functioning/Environment              Frequency Min 2X/week     Progress Toward Goals  OT Goals(current goals can now be found in the care plan section)  Progress towards OT goals: Progressing toward goals     Plan Discharge plan needs to be updated    Co-evaluation                 End  of Session Equipment Utilized During Treatment: Rolling walker;Left knee immobilizer   Activity Tolerance Patient tolerated treatment well   Patient Left in chair;with call bell/phone within reach;with family/visitor present   Nurse Communication          Time: 7096-2836 OT Time Calculation (min): 47 min  Charges: OT General Charges $OT Visit: 1 Procedure OT Treatments $Self Care/Home Management : 8-22 mins $Therapeutic Activity: 23-37 mins  Jules Schick  [phone] 12/11/2014, 11:53 AM

## 2014-12-11 NOTE — Progress Notes (Signed)
Physical Therapy Treatment Patient Details Name: Shareena Nusz MRN: [id number] DOB: 09-04-1951 Today's Date: 12/11/2014    History of Present Illness L TKA    PT Comments    POD # 2 returned to instruct and perform TKR TE's following handout HEP with pt and family (dghtr/son).  Instruced on proper tech and freq.  Instructed on use of ICE.  Instructed on proper positioning L LE in extention while in recliner and bed sleeping.  Instructed on proper tech to get in/out car.  Educated on activity level at home and use of her RW at all times.    Follow Up Recommendations        Equipment Recommendations       Recommendations for Other Services       Precautions / Restrictions Precautions Precautions: Knee Precaution Comments: instructed pt on KI use for amb Required Braces or Orthoses: Knee Immobilizer - Left Knee Immobilizer - Left: Discontinue once straight leg raise with < 10 degree lag Restrictions Weight Bearing Restrictions: No Other Position/Activity Restrictions: WBAT    Mobility  Bed Mobility                  Transfers Overall transfer level: Needs assistance Equipment used: Rolling walker (2 wheeled) Transfers: Sit to/from Stand Sit to Stand: Min assist         General transfer comment: cues hand placement and LE management.  Ambulation/Gait                 Stairs            Wheelchair Mobility    Modified Rankin (Stroke Patients Only)       Balance                                    Cognition Arousal/Alertness: Awake/alert Behavior During Therapy: WFL for tasks assessed/performed Overall Cognitive Status: Within Functional Limits for tasks assessed                      Exercises   Total Knee Replacement TE's 10 reps B LE ankle pumps 10 reps towel squeezes 10 reps knee presses 10 reps heel slides  10 reps SAQ's 10 reps SLR's 10 reps ABD Followed by ICE    General Comments        Pertinent  Vitals/Pain Pain Assessment: 0-10 Pain Score: 4  Pain Location: L knee Pain Descriptors / Indicators: Aching Pain Intervention(s): Repositioned;Ice applied    Home Living                      Prior Function            PT Goals (current goals can now be found in the care plan section) Progress towards PT goals: Progressing toward goals    Frequency       PT Plan      Co-evaluation             End of Session           Time: 6659-9357 PT Time Calculation (min) (ACUTE ONLY): 24 min  Charges:  $Therapeutic Exercise: 8-22 mins $Self Care/Home Management: 8-22                    G Codes:      Rica Koyanagi  PTA [facility]  Acute  Rehab Pager  [phone]  

## 2014-12-11 NOTE — Progress Notes (Signed)
Physical Therapy Treatment Patient Details Name: Anne Lang MRN: [id number] DOB: September 27, 1951 Today's Date: 12/11/2014    History of Present Illness L TKA    PT Comments    POD # 2 am session.  Pt c/o increased pain and poor night last night.  Assisted OOB to amb to hallway and practice going up/down one step forward with RW completely exhausted pt.  Returned to [employer].    Follow Up Recommendations        Equipment Recommendations       Recommendations for Other Services       Precautions / Restrictions Precautions Precautions: Knee Precaution Comments: instructed pt on KI use for amb Required Braces or Orthoses: Knee Immobilizer - Left Knee Immobilizer - Left: Discontinue once straight leg raise with < 10 degree lag Restrictions Weight Bearing Restrictions: No Other Position/Activity Restrictions: WBAT    Mobility  Bed Mobility                  Transfers Overall transfer level: Needs assistance Equipment used: Rolling walker (2 wheeled) Transfers: Sit to/from Stand Sit to Stand: Min assist         General transfer comment: cues hand placement and LE management.  Ambulation/Gait                 Stairs            Wheelchair Mobility    Modified Rankin (Stroke Patients Only)       Balance                                    Cognition Arousal/Alertness: Awake/alert Behavior During Therapy: WFL for tasks assessed/performed Overall Cognitive Status: Within Functional Limits for tasks assessed                      Exercises      General Comments        Pertinent Vitals/Pain Pain Assessment: 0-10 Pain Score: 4  Pain Location: L knee Pain Descriptors / Indicators: Aching Pain Intervention(s): Repositioned;Ice applied    Home Living                      Prior Function            PT Goals (current goals can now be found in the care plan section) Progress towards PT goals: Progressing toward  goals    Frequency       PT Plan      Co-evaluation             End of Session           Time: 8032-1224 PT Time Calculation (min) (ACUTE ONLY): 25 min  Charges:  $Gait Training: 8-22 mins $Therapeutic Activity: 8-22 mins                    G Codes:      Rica Koyanagi  PTA [facility]  Acute  Rehab Pager      [phone]

## 2014-12-12 NOTE — Progress Notes (Signed)
Utilization review completed.  

## 2015-03-19 DIAGNOSIS — D509 Iron deficiency anemia, unspecified: Secondary | ICD-10-CM | POA: Diagnosis not present

## 2015-03-19 DIAGNOSIS — M545 Low back pain: Secondary | ICD-10-CM | POA: Diagnosis not present

## 2015-03-19 DIAGNOSIS — E119 Type 2 diabetes mellitus without complications: Secondary | ICD-10-CM | POA: Diagnosis not present

## 2015-03-19 DIAGNOSIS — E039 Hypothyroidism, unspecified: Secondary | ICD-10-CM | POA: Diagnosis not present

## 2015-03-19 DIAGNOSIS — Z1389 Encounter for screening for other disorder: Secondary | ICD-10-CM | POA: Diagnosis not present

## 2015-03-19 DIAGNOSIS — Z23 Encounter for immunization: Secondary | ICD-10-CM | POA: Diagnosis not present

## 2015-03-19 DIAGNOSIS — I10 Essential (primary) hypertension: Secondary | ICD-10-CM | POA: Diagnosis not present

## 2015-03-19 DIAGNOSIS — E780 Pure hypercholesterolemia: Secondary | ICD-10-CM | POA: Diagnosis not present

## 2015-03-19 DIAGNOSIS — N951 Menopausal and female climacteric states: Secondary | ICD-10-CM | POA: Diagnosis not present

## 2015-03-19 DIAGNOSIS — B373 Candidiasis of vulva and vagina: Secondary | ICD-10-CM | POA: Diagnosis not present

## 2015-04-06 DIAGNOSIS — Z794 Long term (current) use of insulin: Secondary | ICD-10-CM | POA: Diagnosis not present

## 2015-04-06 DIAGNOSIS — R300 Dysuria: Secondary | ICD-10-CM | POA: Diagnosis not present

## 2015-04-06 DIAGNOSIS — E1165 Type 2 diabetes mellitus with hyperglycemia: Secondary | ICD-10-CM | POA: Diagnosis not present

## 2015-04-06 DIAGNOSIS — T50905A Adverse effect of unspecified drugs, medicaments and biological substances, initial encounter: Secondary | ICD-10-CM | POA: Diagnosis not present

## 2015-04-06 DIAGNOSIS — B373 Candidiasis of vulva and vagina: Secondary | ICD-10-CM | POA: Diagnosis not present

## 2015-05-15 DIAGNOSIS — Z471 Aftercare following joint replacement surgery: Secondary | ICD-10-CM | POA: Diagnosis not present

## 2015-05-15 DIAGNOSIS — Z96652 Presence of left artificial knee joint: Secondary | ICD-10-CM | POA: Diagnosis not present

## 2015-05-27 DIAGNOSIS — E119 Type 2 diabetes mellitus without complications: Secondary | ICD-10-CM | POA: Diagnosis not present

## 2015-06-04 DIAGNOSIS — Z780 Asymptomatic menopausal state: Secondary | ICD-10-CM | POA: Diagnosis not present

## 2015-06-17 ENCOUNTER — Other Ambulatory Visit (HOSPITAL_COMMUNITY)
Admission: RE | Admit: 2015-06-17 | Discharge: 2015-06-17 | Disposition: A | Discharge status: Home / Self Care | Payer: Medicare Other | Source: Ambulatory Visit | Attending: Nurse Practitioner | Admitting: Nurse Practitioner

## 2015-06-17 ENCOUNTER — Other Ambulatory Visit: Payer: Self-pay | Admitting: Nurse Practitioner

## 2015-06-17 DIAGNOSIS — Z1151 Encounter for screening for human papillomavirus (HPV): Secondary | ICD-10-CM | POA: Insufficient documentation

## 2015-06-17 DIAGNOSIS — R350 Frequency of micturition: Secondary | ICD-10-CM | POA: Diagnosis not present

## 2015-06-17 DIAGNOSIS — Z01419 Encounter for gynecological examination (general) (routine) without abnormal findings: Secondary | ICD-10-CM | POA: Diagnosis not present

## 2015-06-17 DIAGNOSIS — N9089 Other specified noninflammatory disorders of vulva and perineum: Secondary | ICD-10-CM | POA: Diagnosis not present

## 2015-06-17 DIAGNOSIS — R300 Dysuria: Secondary | ICD-10-CM | POA: Diagnosis not present

## 2015-06-17 DIAGNOSIS — Z124 Encounter for screening for malignant neoplasm of cervix: Secondary | ICD-10-CM | POA: Diagnosis not present

## 2015-06-19 LAB — CYTOLOGY - PAP

## 2015-06-27 ENCOUNTER — Encounter: Payer: Self-pay | Admitting: Gastroenterology

## 2015-10-23 DIAGNOSIS — M545 Low back pain: Secondary | ICD-10-CM | POA: Diagnosis not present

## 2015-10-23 DIAGNOSIS — D509 Iron deficiency anemia, unspecified: Secondary | ICD-10-CM | POA: Diagnosis not present

## 2015-10-23 DIAGNOSIS — E7800 Pure hypercholesterolemia, unspecified: Secondary | ICD-10-CM | POA: Diagnosis not present

## 2015-10-23 DIAGNOSIS — Z0000 Encounter for general adult medical examination without abnormal findings: Secondary | ICD-10-CM | POA: Diagnosis not present

## 2015-10-23 DIAGNOSIS — I10 Essential (primary) hypertension: Secondary | ICD-10-CM | POA: Diagnosis not present

## 2015-10-23 DIAGNOSIS — E119 Type 2 diabetes mellitus without complications: Secondary | ICD-10-CM | POA: Diagnosis not present

## 2015-10-23 DIAGNOSIS — Z1389 Encounter for screening for other disorder: Secondary | ICD-10-CM | POA: Diagnosis not present

## 2015-10-23 DIAGNOSIS — E039 Hypothyroidism, unspecified: Secondary | ICD-10-CM | POA: Diagnosis not present

## 2015-10-23 DIAGNOSIS — Z1211 Encounter for screening for malignant neoplasm of colon: Secondary | ICD-10-CM | POA: Diagnosis not present

## 2015-11-11 ENCOUNTER — Other Ambulatory Visit: Payer: Self-pay

## 2015-11-11 DIAGNOSIS — Z1231 Encounter for screening mammogram for malignant neoplasm of breast: Secondary | ICD-10-CM

## 2015-11-27 ENCOUNTER — Ambulatory Visit
Admission: RE | Admit: 2015-11-27 | Discharge: 2015-11-27 | Disposition: A | Discharge status: Home / Self Care | Payer: Federal, State, Local not specified - PPO | Source: Ambulatory Visit

## 2015-11-27 DIAGNOSIS — Z1231 Encounter for screening mammogram for malignant neoplasm of breast: Secondary | ICD-10-CM

## 2015-12-15 DIAGNOSIS — Z8371 Family history of colonic polyps: Secondary | ICD-10-CM | POA: Diagnosis not present

## 2015-12-23 ENCOUNTER — Encounter: Payer: Self-pay | Admitting: Internal Medicine

## 2015-12-30 ENCOUNTER — Telehealth: Payer: Self-pay | Admitting: Internal Medicine

## 2015-12-30 NOTE — Telephone Encounter (Signed)
Pt received our Recall Reminder Letter in the mail.

## 2016-04-22 DIAGNOSIS — I10 Essential (primary) hypertension: Secondary | ICD-10-CM | POA: Diagnosis not present

## 2016-04-22 DIAGNOSIS — Z1211 Encounter for screening for malignant neoplasm of colon: Secondary | ICD-10-CM | POA: Diagnosis not present

## 2016-04-22 DIAGNOSIS — M7551 Bursitis of right shoulder: Secondary | ICD-10-CM | POA: Diagnosis not present

## 2016-04-22 DIAGNOSIS — D509 Iron deficiency anemia, unspecified: Secondary | ICD-10-CM | POA: Diagnosis not present

## 2016-04-22 DIAGNOSIS — E7800 Pure hypercholesterolemia, unspecified: Secondary | ICD-10-CM | POA: Diagnosis not present

## 2016-04-22 DIAGNOSIS — E1165 Type 2 diabetes mellitus with hyperglycemia: Secondary | ICD-10-CM | POA: Diagnosis not present

## 2016-04-22 DIAGNOSIS — Z7984 Long term (current) use of oral hypoglycemic drugs: Secondary | ICD-10-CM | POA: Diagnosis not present

## 2016-04-22 DIAGNOSIS — F4320 Adjustment disorder, unspecified: Secondary | ICD-10-CM | POA: Diagnosis not present

## 2016-05-24 DIAGNOSIS — Z794 Long term (current) use of insulin: Secondary | ICD-10-CM | POA: Diagnosis not present

## 2016-05-24 DIAGNOSIS — Z1211 Encounter for screening for malignant neoplasm of colon: Secondary | ICD-10-CM | POA: Diagnosis not present

## 2016-05-24 DIAGNOSIS — Z6838 Body mass index (BMI) 38.0-38.9, adult: Secondary | ICD-10-CM | POA: Diagnosis not present

## 2016-05-24 DIAGNOSIS — B373 Candidiasis of vulva and vagina: Secondary | ICD-10-CM | POA: Diagnosis not present

## 2016-05-24 DIAGNOSIS — E1165 Type 2 diabetes mellitus with hyperglycemia: Secondary | ICD-10-CM | POA: Diagnosis not present

## 2016-05-24 DIAGNOSIS — Z713 Dietary counseling and surveillance: Secondary | ICD-10-CM | POA: Diagnosis not present

## 2016-06-03 ENCOUNTER — Encounter: Payer: Self-pay | Admitting: Skilled Nursing Facility1

## 2016-06-03 ENCOUNTER — Encounter: Payer: Medicare Other | Attending: Family Medicine | Admitting: Skilled Nursing Facility1

## 2016-06-03 DIAGNOSIS — Z713 Dietary counseling and surveillance: Secondary | ICD-10-CM | POA: Diagnosis not present

## 2016-06-03 DIAGNOSIS — E119 Type 2 diabetes mellitus without complications: Secondary | ICD-10-CM | POA: Insufficient documentation

## 2016-06-03 NOTE — Progress Notes (Signed)
Diabetes Self-Management Education  Visit Type: First/Initial  Appt. Start Time: 8:00 Appt. End Time: 9:30  06/03/2016  [name], identified by name and date of birth, is a 65 y.o. female with a diagnosis of Diabetes: Type 2.   ASSESSMENT  Height 5\' 5"  (1.651 m), weight 236 lb (107 kg). Body mass index is 39.27 kg/m. Pt states food is all she has. Pt states her sister dies in 2022/12/29 and her birthday is tomorrow.      Diabetes Self-Management Education - 06/03/16 0801      Visit Information   Visit Type First/Initial     Initial Visit   Diabetes Type Type 2   Are you currently following a meal plan? No   Are you taking your medications as prescribed? Yes   Date Diagnosed 2013     Health Coping   How would you rate your overall health? Good     Psychosocial Assessment   Patient Belief/Attitude about Diabetes Denial   Self-care barriers None   Self-management support Family   Patient Concerns Nutrition/Meal planning;Glycemic Control   Special Needs None     Pre-Education Assessment   Patient understands the diabetes disease and treatment process. Needs Instruction   Patient understands incorporating nutritional management into lifestyle. Needs Instruction   Patient undertands incorporating physical activity into lifestyle. Needs Instruction   Patient understands using medications safely. Needs Instruction   Patient understands monitoring blood glucose, interpreting and using results Needs Instruction   Patient understands prevention, detection, and treatment of acute complications. Needs Instruction   Patient understands prevention, detection, and treatment of chronic complications. Needs Instruction   Patient understands how to develop strategies to address psychosocial issues. Needs Instruction   Patient understands how to develop strategies to promote health/change behavior. Needs Instruction     Complications   Last HgB A1C per patient/outside source 8.2  %   How often do you check your blood sugar? 1-2 times/day   Fasting Blood glucose range (mg/dL) 130-179   Have you had a dilated eye exam in the past 12 months? No   Have you had a dental exam in the past 12 months? Yes   Are you checking your feet? No     Dietary Intake   Breakfast fast food   Snack (morning) hard candy mints   Lunch sandwich, grapes, chips   Snack (afternoon) dark chocolate raisins    Dinner lean cuisine-----frozen meal   Snack (evening) fast food, sugar free popcicles    Beverage(s) half and half tea, regular soda, water, coffee with splenda     Exercise   Exercise Type Moderate (swimming / aerobic walking)   How many days per week to you exercise? 3   How many minutes per day do you exercise? 30   Total minutes per week of exercise 90     Patient Education   Previous Diabetes Education No   Disease state  Definition of diabetes, type 1 and 2, and the diagnosis of diabetes;Factors that contribute to the development of diabetes   Nutrition management  Role of diet in the treatment of diabetes and the relationship between the three main macronutrients and blood glucose level;Food label reading, portion sizes and measuring food.;Carbohydrate counting;Reviewed blood glucose goals for pre and post meals and how to evaluate the patients' food intake on their blood glucose level.;Information on hints to eating out and maintain blood glucose control.   Physical activity and exercise  Role of exercise on diabetes management, blood  pressure control and cardiac health.   Monitoring Purpose and frequency of SMBG.;Taught/evaluated SMBG meter.;Interpreting lab values - A1C, lipid, urine microalbumina.;Yearly dilated eye exam;Daily foot exams;Taught/discussed recording of test results and interpretation of SMBG.   Acute complications Taught treatment of hypoglycemia - the 15 rule.   Chronic complications Dental care;Retinopathy and reason for yearly dilated eye exams;Assessed and  discussed foot care and prevention of foot problems   Psychosocial adjustment Worked with patient to identify barriers to care and solutions;Role of stress on diabetes;Helped patient identify a support system for diabetes management     Individualized Goals (developed by patient)   Nutrition Follow meal plan discussed;General guidelines for healthy choices and portions discussed   Physical Activity Exercise 1-2 times per week;15 minutes per day   Medications take my medication as prescribed   Monitoring  test blood glucose pre and post meals as discussed     Post-Education Assessment   Patient understands the diabetes disease and treatment process. Demonstrates understanding / competency   Patient understands incorporating nutritional management into lifestyle. Demonstrates understanding / competency   Patient undertands incorporating physical activity into lifestyle. Demonstrates understanding / competency   Patient understands using medications safely. Demonstrates understanding / competency   Patient understands monitoring blood glucose, interpreting and using results Demonstrates understanding / competency   Patient understands prevention, detection, and treatment of acute complications. Demonstrates understanding / competency   Patient understands prevention, detection, and treatment of chronic complications. Demonstrates understanding / competency   Patient understands how to develop strategies to address psychosocial issues. Demonstrates understanding / competency   Patient understands how to develop strategies to promote health/change behavior. Demonstrates understanding / competency     Outcomes   Expected Outcomes Demonstrated interest in learning. Expect positive outcomes   Future DMSE PRN   Program Status Completed      Individualized Plan for Diabetes Self-Management Training:   Learning Objective:  Patient will have a greater understanding of diabetes  self-management. Patient education plan is to attend individual and/or group sessions per assessed needs and concerns.   Plan:   There are no Patient Instructions on file for this visit.  Expected Outcomes:  Demonstrated interest in learning. Expect positive outcomes  Education material provided: Living Well with Diabetes, Meal plan card, My Plate, Snack sheet and Support group flyer  If problems or questions, patient to contact team via:  Phone  Future DSME appointment: PRN

## 2016-06-08 DIAGNOSIS — E119 Type 2 diabetes mellitus without complications: Secondary | ICD-10-CM | POA: Diagnosis not present

## 2016-06-08 DIAGNOSIS — H2513 Age-related nuclear cataract, bilateral: Secondary | ICD-10-CM | POA: Diagnosis not present

## 2016-06-21 DIAGNOSIS — E1165 Type 2 diabetes mellitus with hyperglycemia: Secondary | ICD-10-CM | POA: Diagnosis not present

## 2016-06-21 DIAGNOSIS — Z7984 Long term (current) use of oral hypoglycemic drugs: Secondary | ICD-10-CM | POA: Diagnosis not present

## 2016-10-15 DIAGNOSIS — Z1211 Encounter for screening for malignant neoplasm of colon: Secondary | ICD-10-CM | POA: Diagnosis not present

## 2016-10-27 DIAGNOSIS — I10 Essential (primary) hypertension: Secondary | ICD-10-CM | POA: Diagnosis not present

## 2016-10-27 DIAGNOSIS — M79621 Pain in right upper arm: Secondary | ICD-10-CM | POA: Diagnosis not present

## 2016-10-27 DIAGNOSIS — E119 Type 2 diabetes mellitus without complications: Secondary | ICD-10-CM | POA: Diagnosis not present

## 2016-10-27 DIAGNOSIS — F4320 Adjustment disorder, unspecified: Secondary | ICD-10-CM | POA: Diagnosis not present

## 2016-10-27 DIAGNOSIS — E039 Hypothyroidism, unspecified: Secondary | ICD-10-CM | POA: Diagnosis not present

## 2016-10-27 DIAGNOSIS — Z0000 Encounter for general adult medical examination without abnormal findings: Secondary | ICD-10-CM | POA: Diagnosis not present

## 2016-10-27 DIAGNOSIS — D509 Iron deficiency anemia, unspecified: Secondary | ICD-10-CM | POA: Diagnosis not present

## 2016-10-27 DIAGNOSIS — Z6838 Body mass index (BMI) 38.0-38.9, adult: Secondary | ICD-10-CM | POA: Diagnosis not present

## 2016-10-27 DIAGNOSIS — E7800 Pure hypercholesterolemia, unspecified: Secondary | ICD-10-CM | POA: Diagnosis not present

## 2016-10-27 DIAGNOSIS — Z7984 Long term (current) use of oral hypoglycemic drugs: Secondary | ICD-10-CM | POA: Diagnosis not present

## 2016-11-05 ENCOUNTER — Other Ambulatory Visit: Payer: Self-pay | Admitting: Family Medicine

## 2016-11-05 DIAGNOSIS — Z1231 Encounter for screening mammogram for malignant neoplasm of breast: Secondary | ICD-10-CM

## 2016-11-29 ENCOUNTER — Ambulatory Visit
Admission: RE | Admit: 2016-11-29 | Discharge: 2016-11-29 | Disposition: A | Discharge status: Home / Self Care | Payer: Medicare Other | Source: Ambulatory Visit | Attending: Family Medicine | Admitting: Family Medicine

## 2016-11-29 DIAGNOSIS — Z1231 Encounter for screening mammogram for malignant neoplasm of breast: Secondary | ICD-10-CM | POA: Diagnosis not present

## 2016-12-01 DIAGNOSIS — M19011 Primary osteoarthritis, right shoulder: Secondary | ICD-10-CM | POA: Diagnosis not present

## 2017-01-14 DIAGNOSIS — M65341 Trigger finger, right ring finger: Secondary | ICD-10-CM | POA: Diagnosis not present

## 2017-02-10 DIAGNOSIS — L720 Epidermal cyst: Secondary | ICD-10-CM | POA: Diagnosis not present

## 2017-02-10 DIAGNOSIS — D225 Melanocytic nevi of trunk: Secondary | ICD-10-CM | POA: Diagnosis not present

## 2017-02-10 DIAGNOSIS — L814 Other melanin hyperpigmentation: Secondary | ICD-10-CM | POA: Diagnosis not present

## 2017-02-10 DIAGNOSIS — L821 Other seborrheic keratosis: Secondary | ICD-10-CM | POA: Diagnosis not present

## 2017-02-10 DIAGNOSIS — D1801 Hemangioma of skin and subcutaneous tissue: Secondary | ICD-10-CM | POA: Diagnosis not present

## 2017-04-27 DIAGNOSIS — D509 Iron deficiency anemia, unspecified: Secondary | ICD-10-CM | POA: Diagnosis not present

## 2017-04-27 DIAGNOSIS — E119 Type 2 diabetes mellitus without complications: Secondary | ICD-10-CM | POA: Diagnosis not present

## 2017-04-27 DIAGNOSIS — I10 Essential (primary) hypertension: Secondary | ICD-10-CM | POA: Diagnosis not present

## 2017-04-27 DIAGNOSIS — E039 Hypothyroidism, unspecified: Secondary | ICD-10-CM | POA: Diagnosis not present

## 2017-04-27 DIAGNOSIS — Z7984 Long term (current) use of oral hypoglycemic drugs: Secondary | ICD-10-CM | POA: Diagnosis not present

## 2017-04-27 DIAGNOSIS — B373 Candidiasis of vulva and vagina: Secondary | ICD-10-CM | POA: Diagnosis not present

## 2017-04-27 DIAGNOSIS — E785 Hyperlipidemia, unspecified: Secondary | ICD-10-CM | POA: Diagnosis not present

## 2017-06-10 DIAGNOSIS — E119 Type 2 diabetes mellitus without complications: Secondary | ICD-10-CM | POA: Diagnosis not present

## 2017-10-27 ENCOUNTER — Other Ambulatory Visit: Payer: Self-pay | Admitting: Family Medicine

## 2017-10-27 DIAGNOSIS — Z1231 Encounter for screening mammogram for malignant neoplasm of breast: Secondary | ICD-10-CM

## 2017-11-21 DIAGNOSIS — Z7984 Long term (current) use of oral hypoglycemic drugs: Secondary | ICD-10-CM | POA: Diagnosis not present

## 2017-11-21 DIAGNOSIS — Z0000 Encounter for general adult medical examination without abnormal findings: Secondary | ICD-10-CM | POA: Diagnosis not present

## 2017-11-21 DIAGNOSIS — F4320 Adjustment disorder, unspecified: Secondary | ICD-10-CM | POA: Diagnosis not present

## 2017-11-21 DIAGNOSIS — R202 Paresthesia of skin: Secondary | ICD-10-CM | POA: Diagnosis not present

## 2017-11-21 DIAGNOSIS — I10 Essential (primary) hypertension: Secondary | ICD-10-CM | POA: Diagnosis not present

## 2017-11-21 DIAGNOSIS — Z6838 Body mass index (BMI) 38.0-38.9, adult: Secondary | ICD-10-CM | POA: Diagnosis not present

## 2017-11-21 DIAGNOSIS — M79621 Pain in right upper arm: Secondary | ICD-10-CM | POA: Diagnosis not present

## 2017-11-21 DIAGNOSIS — D509 Iron deficiency anemia, unspecified: Secondary | ICD-10-CM | POA: Diagnosis not present

## 2017-11-21 DIAGNOSIS — E1149 Type 2 diabetes mellitus with other diabetic neurological complication: Secondary | ICD-10-CM | POA: Diagnosis not present

## 2017-11-21 DIAGNOSIS — E039 Hypothyroidism, unspecified: Secondary | ICD-10-CM | POA: Diagnosis not present

## 2017-11-21 DIAGNOSIS — E7800 Pure hypercholesterolemia, unspecified: Secondary | ICD-10-CM | POA: Diagnosis not present

## 2017-12-01 ENCOUNTER — Encounter: Payer: Self-pay | Admitting: Podiatry

## 2017-12-01 ENCOUNTER — Ambulatory Visit: Payer: Medicare Other

## 2017-12-01 ENCOUNTER — Ambulatory Visit (INDEPENDENT_AMBULATORY_CARE_PROVIDER_SITE_OTHER): Payer: Medicare Other

## 2017-12-01 ENCOUNTER — Ambulatory Visit (INDEPENDENT_AMBULATORY_CARE_PROVIDER_SITE_OTHER): Payer: Medicare Other | Admitting: Podiatry

## 2017-12-01 VITALS — BP 119/74 | HR 92 | Resp 16

## 2017-12-01 DIAGNOSIS — M2041 Other hammer toe(s) (acquired), right foot: Secondary | ICD-10-CM

## 2017-12-01 DIAGNOSIS — M778 Other enthesopathies, not elsewhere classified: Secondary | ICD-10-CM

## 2017-12-01 DIAGNOSIS — M7751 Other enthesopathy of right foot: Secondary | ICD-10-CM

## 2017-12-01 DIAGNOSIS — M1990 Unspecified osteoarthritis, unspecified site: Secondary | ICD-10-CM | POA: Diagnosis not present

## 2017-12-01 DIAGNOSIS — M2042 Other hammer toe(s) (acquired), left foot: Secondary | ICD-10-CM | POA: Diagnosis not present

## 2017-12-01 DIAGNOSIS — E1142 Type 2 diabetes mellitus with diabetic polyneuropathy: Secondary | ICD-10-CM

## 2017-12-01 DIAGNOSIS — M779 Enthesopathy, unspecified: Principal | ICD-10-CM

## 2017-12-01 DIAGNOSIS — S9031XA Contusion of right foot, initial encounter: Secondary | ICD-10-CM

## 2017-12-01 NOTE — Progress Notes (Signed)
Subjective:  Patient ID: Anne Lang, female    DOB: [birth year],  MRN: [id number] HPI Chief Complaint  Patient presents with  . Foot Pain    Dorsal foot right - aching x 2 weeks, noticed after [place] 4 days straight, just been resting it  . Diabetes    Diabetic - last a1c was 6.7 - getting tingling sensations in feet    67 y.o. female presents with the above complaint.     Past Medical History:  Diagnosis Date  . Arthritis   . Diabetes mellitus without complication ([place])   . Hemorrhoids   . Hypertension   . Hypothyroidism   . Impingement syndrome of right shoulder    Past Surgical History:  Procedure Laterality Date  . colonscopy     . ingrown toenail     1969   . TONSILLECTOMY    . TOTAL KNEE ARTHROPLASTY Left 12/09/2014   Procedure: LEFT TOTAL KNEE ARTHROPLASTY;  Surgeon: [name], MD;  Location: [facility];  Service: Orthopedics;  Laterality: Left;  . trauma to left thumb     with surgical repair   . TUBAL LIGATION      Current Outpatient Medications:  .  Ascorbic Acid (VITAMIN C PO), Take by mouth., Disp: , Rfl:  .  aspirin EC 81 MG tablet, Take 81 mg by mouth daily., Disp: , Rfl:  .  Calcium Carbonate-Vit D-Min (CALCIUM 1200 PO), Take by mouth., Disp: , Rfl:  .  Dulaglutide (TRULICITY [place]), Inject into the skin., Disp: , Rfl:  .  meloxicam (MOBIC) 15 MG tablet, Take 15 mg by mouth daily., Disp: , Rfl:  .  metFORMIN (GLUCOPHAGE) 1000 MG tablet, Take 1,000 mg by mouth 2 (two) times daily with a meal., Disp: , Rfl:  .  Multiple Vitamins-Minerals (CENTRUM SILVER PO), Take by mouth., Disp: , Rfl:  .  Omega-3 Fatty Acids (FISH OIL PO), Take by mouth., Disp: , Rfl:  .  atorvastatin (LIPITOR) 10 MG tablet, Take 5 mg by mouth daily., Disp: , Rfl:  .  ferrous sulfate 325 (65 FE) MG tablet, Take 325 mg by mouth daily with breakfast., Disp: , Rfl:  .  hydrochlorothiazide (HYDRODIURIL) 25 MG tablet, Take 25 mg by mouth every morning., Disp: , Rfl: 0 .   levothyroxine (SYNTHROID, LEVOTHROID) 150 MCG tablet, Take 150 mcg by mouth daily before breakfast., Disp: , Rfl:  .  ramipril (ALTACE) 10 MG capsule, Take 10 mg by mouth every morning., Disp: , Rfl:   Allergies  Allergen Reactions  . Sulfa Antibiotics     unknown   Review of Systems  All other systems reviewed and are negative.  Objective:   General: Well developed, nourished, in no acute distress, alert and oriented x3   Dermatological: Skin is warm, dry and supple bilateral. Nails x 10 are well maintained; remaining integument appears unremarkable at this time. There are no open sores, no preulcerative lesions, no rash or signs of infection present.  Vascular: Dorsalis Pedis artery and Posterior Tibial artery pedal pulses are 2/4 bilateral with immedate capillary fill time. Pedal hair growth present. No varicosities and no lower extremity edema present bilateral.   Neruologic: Grossly intact via light touch bilateral. Vibratory intact via tuning fork bilateral. Protective threshold with Semmes Wienstein monofilament intact to all pedal sites bilateral. Patellar and Achilles deep tendon reflexes 2+ bilateral. No Babinski or clonus noted bilateral.   Musculoskeletal: No gross boney pedal deformities bilateral. No pain, crepitus, or limitation noted  with foot and ankle range of motion bilateral. Muscular strength 5/5 in all groups tested bilateral.  Gait: Unassisted, Nonantalgic.    Radiographs:  3 views of the bilateral foot taken today does not demonstrate any type of acute abnormality though it does demonstrate some osteoarthritic changes at the base of the second metatarsal and intermediate cuneiform of the right foot.  There is some dorsal spurring to this area on lateral view joint space narrowing on AP view.  No signs of infection.  Assessment & Plan:   Assessment: Capsulitis osteoarthritis midfoot  Plan: Discussed etiology pathology conservative versus surgical therapies.   At this point I injected 20 mg of Kenalog 5 mg of Marcaine to the dorsal aspect of the right foot after sterile Betadine skin prep and verbal consent.  She tolerated procedure well without complications I will follow-up with her on an as-needed basis.  I may need to discuss orthotics next visit.     Anne Lang T. [place], [place]

## 2017-12-02 ENCOUNTER — Ambulatory Visit
Admission: RE | Admit: 2017-12-02 | Discharge: 2017-12-02 | Disposition: A | Discharge status: Home / Self Care | Payer: Medicare Other | Source: Ambulatory Visit | Attending: Family Medicine | Admitting: Family Medicine

## 2017-12-02 DIAGNOSIS — Z1231 Encounter for screening mammogram for malignant neoplasm of breast: Secondary | ICD-10-CM | POA: Diagnosis not present

## 2017-12-29 DIAGNOSIS — M25521 Pain in right elbow: Secondary | ICD-10-CM | POA: Diagnosis not present

## 2017-12-29 DIAGNOSIS — M25511 Pain in right shoulder: Secondary | ICD-10-CM | POA: Diagnosis not present

## 2018-01-03 ENCOUNTER — Other Ambulatory Visit: Payer: Self-pay

## 2018-01-03 ENCOUNTER — Ambulatory Visit (INDEPENDENT_AMBULATORY_CARE_PROVIDER_SITE_OTHER): Payer: Medicare Other | Admitting: Podiatry

## 2018-01-03 DIAGNOSIS — M79605 Pain in left leg: Principal | ICD-10-CM

## 2018-01-03 DIAGNOSIS — M778 Other enthesopathies, not elsewhere classified: Secondary | ICD-10-CM

## 2018-01-03 DIAGNOSIS — M779 Enthesopathy, unspecified: Principal | ICD-10-CM

## 2018-01-03 DIAGNOSIS — M7751 Other enthesopathy of right foot: Secondary | ICD-10-CM

## 2018-01-03 DIAGNOSIS — M79604 Pain in right leg: Secondary | ICD-10-CM

## 2018-01-03 DIAGNOSIS — E1142 Type 2 diabetes mellitus with diabetic polyneuropathy: Secondary | ICD-10-CM | POA: Diagnosis not present

## 2018-01-03 MED ORDER — DICLOFENAC SODIUM 75 MG PO TBEC
75.0000 mg | DELAYED_RELEASE_TABLET | Freq: Two times a day (BID) | ORAL | 1 refills | Status: DC
Start: 2018-01-03 — End: 2022-02-03

## 2018-01-03 NOTE — Progress Notes (Signed)
She presents today for follow-up of her right foot pain.  She states that she is not doing very well she still has pain in the right ankle she states that it feels like it is at higher than on top of her foot.  States that the injection of the top of her foot did not help the ankle at all.  She states that the meloxicam does not seem to be helping.  She is also still having pain after activity and even at rest as sharp stabbing pains in the bilateral lower extremity left greater than right.  Objective: Pulses are barely palpable bilateral capillary fill time is a little bit sluggish neurologic sensorium is intact per Semmes Weinstein monofilament deep tendon reflexes are intact muscle strength was 5/5 dorsiflexion plantar flexors inverters everters onto the musculature is intact.  The right ankle does appear to be a little bit swollen but he has a good range of motion without crepitation.  It is mildly tender on the sharp dorsiflexion.  She has no repeated tenderness on palpation of the left lower extremity.  Assessment: After sterile Betadine skin prep injected 20 mg Kenalog 5 mg Marcaine to the anterior lateral ankle right.  She tolerated procedure well without complications.  No fluid was drawn off.  We scheduled her for vascular evaluation as well as dispensing a new prescription for diclofenac rather than meloxicam.  She was instructed to discontinue use of meloxicam start with diclofenac 75 mg twice daily for the next 30 days.  I will follow-up with her in 4-6 weeks.

## 2018-01-04 DIAGNOSIS — S46111A Strain of muscle, fascia and tendon of long head of biceps, right arm, initial encounter: Secondary | ICD-10-CM | POA: Diagnosis not present

## 2018-01-04 DIAGNOSIS — M19011 Primary osteoarthritis, right shoulder: Secondary | ICD-10-CM | POA: Diagnosis not present

## 2018-01-04 DIAGNOSIS — M19019 Primary osteoarthritis, unspecified shoulder: Secondary | ICD-10-CM | POA: Diagnosis not present

## 2018-01-05 ENCOUNTER — Telehealth: Payer: Self-pay | Admitting: *Deleted

## 2018-01-05 NOTE — Telephone Encounter (Signed)
Orders faxed by Gretta Arab, RN.

## 2018-01-05 NOTE — Telephone Encounter (Signed)
-----   Message from Rodriguez Camp sent at 01/03/2018  8:20 AM EST ----- ABIs bilateral - leg pain

## 2018-01-09 ENCOUNTER — Other Ambulatory Visit: Payer: Self-pay | Admitting: Podiatry

## 2018-01-09 DIAGNOSIS — M79605 Pain in left leg: Principal | ICD-10-CM

## 2018-01-09 DIAGNOSIS — M79604 Pain in right leg: Secondary | ICD-10-CM

## 2018-01-16 ENCOUNTER — Ambulatory Visit (HOSPITAL_COMMUNITY)
Admission: RE | Admit: 2018-01-16 | Discharge: 2018-01-16 | Disposition: A | Discharge status: Home / Self Care | Payer: Medicare Other | Source: Ambulatory Visit | Attending: Cardiology | Admitting: Cardiology

## 2018-01-16 DIAGNOSIS — M79604 Pain in right leg: Secondary | ICD-10-CM | POA: Insufficient documentation

## 2018-01-16 DIAGNOSIS — M79605 Pain in left leg: Secondary | ICD-10-CM | POA: Diagnosis not present

## 2018-01-18 ENCOUNTER — Telehealth: Payer: Self-pay | Admitting: *Deleted

## 2018-01-18 NOTE — Telephone Encounter (Signed)
I informed pt of Dr. Stephenie Acres review of results and orders. Pt states understanding and asked if low potassium would cause her problem and if Dr. Milinda Pointer or her PCP would need to see. I told pt it was possible, and since it would be a systemic problem she should see her PCP.

## 2018-01-18 NOTE — Telephone Encounter (Signed)
-----   Message from Garrel Ridgel, [place] sent at 01/17/2018  4:57 PM EDT ----- Vita Barley to be relatively normal.  She should have a  F/u appt.

## 2018-01-24 DIAGNOSIS — S46111D Strain of muscle, fascia and tendon of long head of biceps, right arm, subsequent encounter: Secondary | ICD-10-CM | POA: Diagnosis not present

## 2018-01-24 DIAGNOSIS — M19011 Primary osteoarthritis, right shoulder: Secondary | ICD-10-CM | POA: Diagnosis not present

## 2018-02-20 DIAGNOSIS — M25571 Pain in right ankle and joints of right foot: Secondary | ICD-10-CM | POA: Diagnosis not present

## 2018-02-20 DIAGNOSIS — M2041 Other hammer toe(s) (acquired), right foot: Secondary | ICD-10-CM | POA: Diagnosis not present

## 2018-02-20 DIAGNOSIS — M79671 Pain in right foot: Secondary | ICD-10-CM | POA: Diagnosis not present

## 2018-02-21 ENCOUNTER — Ambulatory Visit: Payer: Federal, State, Local not specified - PPO | Admitting: Podiatry

## 2018-02-25 DIAGNOSIS — M79671 Pain in right foot: Secondary | ICD-10-CM | POA: Diagnosis not present

## 2018-02-25 DIAGNOSIS — M25571 Pain in right ankle and joints of right foot: Secondary | ICD-10-CM | POA: Diagnosis not present

## 2018-03-03 DIAGNOSIS — M19071 Primary osteoarthritis, right ankle and foot: Secondary | ICD-10-CM | POA: Diagnosis not present

## 2018-03-03 DIAGNOSIS — J209 Acute bronchitis, unspecified: Secondary | ICD-10-CM | POA: Diagnosis not present

## 2018-03-03 DIAGNOSIS — R05 Cough: Secondary | ICD-10-CM | POA: Diagnosis not present

## 2018-05-23 DIAGNOSIS — M79621 Pain in right upper arm: Secondary | ICD-10-CM | POA: Diagnosis not present

## 2018-05-23 DIAGNOSIS — Z862 Personal history of diseases of the blood and blood-forming organs and certain disorders involving the immune mechanism: Secondary | ICD-10-CM | POA: Diagnosis not present

## 2018-05-23 DIAGNOSIS — E7800 Pure hypercholesterolemia, unspecified: Secondary | ICD-10-CM | POA: Diagnosis not present

## 2018-05-23 DIAGNOSIS — E039 Hypothyroidism, unspecified: Secondary | ICD-10-CM | POA: Diagnosis not present

## 2018-05-23 DIAGNOSIS — E785 Hyperlipidemia, unspecified: Secondary | ICD-10-CM | POA: Diagnosis not present

## 2018-05-23 DIAGNOSIS — E1149 Type 2 diabetes mellitus with other diabetic neurological complication: Secondary | ICD-10-CM | POA: Diagnosis not present

## 2018-05-23 DIAGNOSIS — E1165 Type 2 diabetes mellitus with hyperglycemia: Secondary | ICD-10-CM | POA: Diagnosis not present

## 2018-05-23 DIAGNOSIS — I10 Essential (primary) hypertension: Secondary | ICD-10-CM | POA: Diagnosis not present

## 2018-06-01 ENCOUNTER — Ambulatory Visit: Payer: Federal, State, Local not specified - PPO | Admitting: Podiatry

## 2018-06-12 DIAGNOSIS — E119 Type 2 diabetes mellitus without complications: Secondary | ICD-10-CM | POA: Diagnosis not present

## 2018-09-13 DIAGNOSIS — N760 Acute vaginitis: Secondary | ICD-10-CM | POA: Diagnosis not present

## 2018-11-09 ENCOUNTER — Other Ambulatory Visit: Payer: Self-pay | Admitting: Family Medicine

## 2018-11-09 DIAGNOSIS — Z1231 Encounter for screening mammogram for malignant neoplasm of breast: Secondary | ICD-10-CM

## 2018-11-23 DIAGNOSIS — I10 Essential (primary) hypertension: Secondary | ICD-10-CM | POA: Diagnosis not present

## 2018-11-23 DIAGNOSIS — Z1159 Encounter for screening for other viral diseases: Secondary | ICD-10-CM | POA: Diagnosis not present

## 2018-11-23 DIAGNOSIS — E7800 Pure hypercholesterolemia, unspecified: Secondary | ICD-10-CM | POA: Diagnosis not present

## 2018-11-23 DIAGNOSIS — Z7984 Long term (current) use of oral hypoglycemic drugs: Secondary | ICD-10-CM | POA: Diagnosis not present

## 2018-11-23 DIAGNOSIS — E1149 Type 2 diabetes mellitus with other diabetic neurological complication: Secondary | ICD-10-CM | POA: Diagnosis not present

## 2018-11-23 DIAGNOSIS — Z1389 Encounter for screening for other disorder: Secondary | ICD-10-CM | POA: Diagnosis not present

## 2018-11-23 DIAGNOSIS — Z0000 Encounter for general adult medical examination without abnormal findings: Secondary | ICD-10-CM | POA: Diagnosis not present

## 2018-11-23 DIAGNOSIS — E039 Hypothyroidism, unspecified: Secondary | ICD-10-CM | POA: Diagnosis not present

## 2018-11-23 DIAGNOSIS — Z6837 Body mass index (BMI) 37.0-37.9, adult: Secondary | ICD-10-CM | POA: Diagnosis not present

## 2018-11-23 DIAGNOSIS — D509 Iron deficiency anemia, unspecified: Secondary | ICD-10-CM | POA: Diagnosis not present

## 2018-11-23 DIAGNOSIS — E785 Hyperlipidemia, unspecified: Secondary | ICD-10-CM | POA: Diagnosis not present

## 2018-11-23 DIAGNOSIS — E1165 Type 2 diabetes mellitus with hyperglycemia: Secondary | ICD-10-CM | POA: Diagnosis not present

## 2018-12-04 DIAGNOSIS — M19011 Primary osteoarthritis, right shoulder: Secondary | ICD-10-CM | POA: Diagnosis not present

## 2018-12-04 DIAGNOSIS — M7551 Bursitis of right shoulder: Secondary | ICD-10-CM | POA: Diagnosis not present

## 2018-12-06 ENCOUNTER — Ambulatory Visit
Admission: RE | Admit: 2018-12-06 | Discharge: 2018-12-06 | Disposition: A | Discharge status: Home / Self Care | Payer: Medicare Other | Source: Ambulatory Visit | Attending: Family Medicine | Admitting: Family Medicine

## 2018-12-06 DIAGNOSIS — Z1231 Encounter for screening mammogram for malignant neoplasm of breast: Secondary | ICD-10-CM | POA: Diagnosis not present

## 2018-12-06 IMAGING — MG 2D DIGITAL SCREENING BILATERAL MAMMOGRAM WITH 3D TOMO WITH CAD
6 series · 6 of 18 positions shown · non-contrast
Comparison: Previous exam(s).

CLINICAL DATA: Screening.

EXAM:
2D DIGITAL SCREENING BILATERAL MAMMOGRAM WITH 3D TOMO WITH CAD

[R CC]
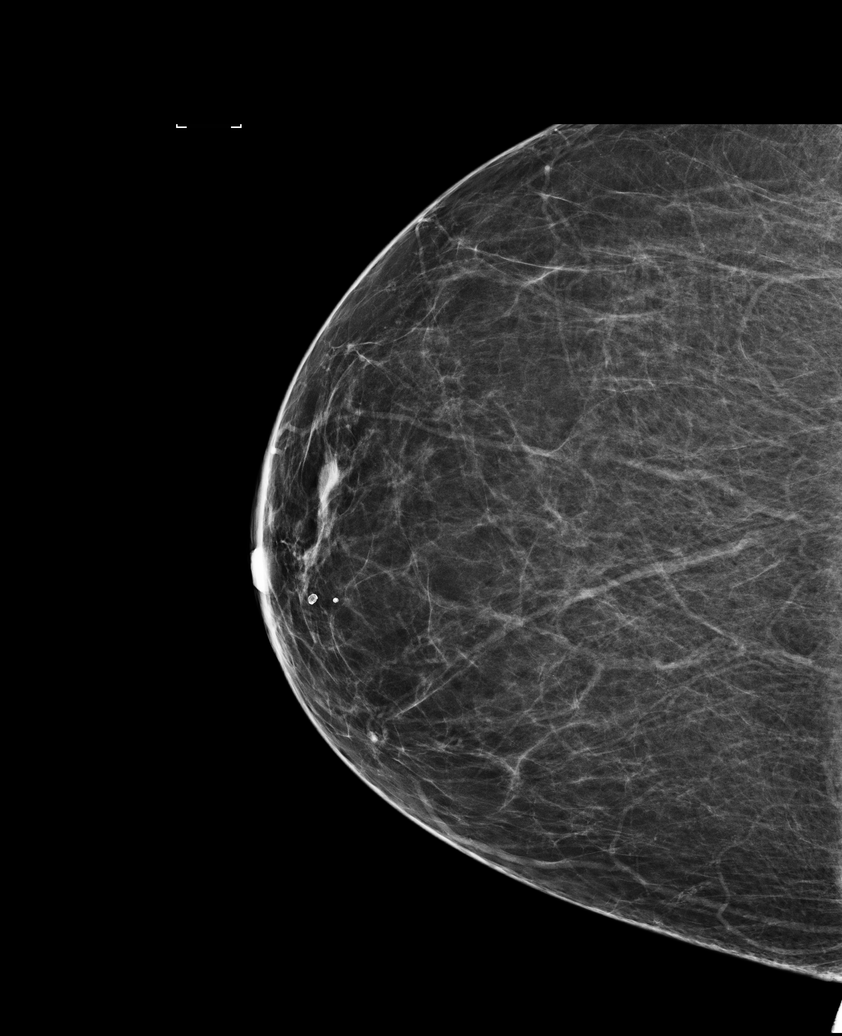

[L MLO synth-2D]
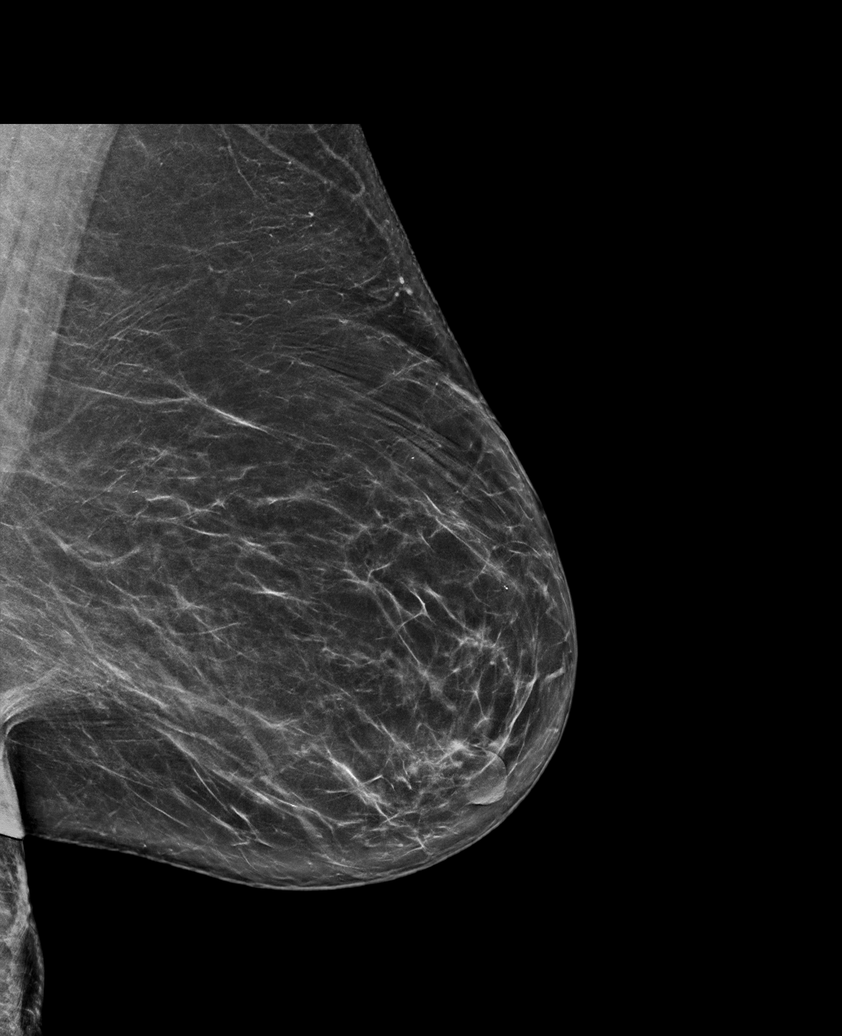

[L CC synth-2D]
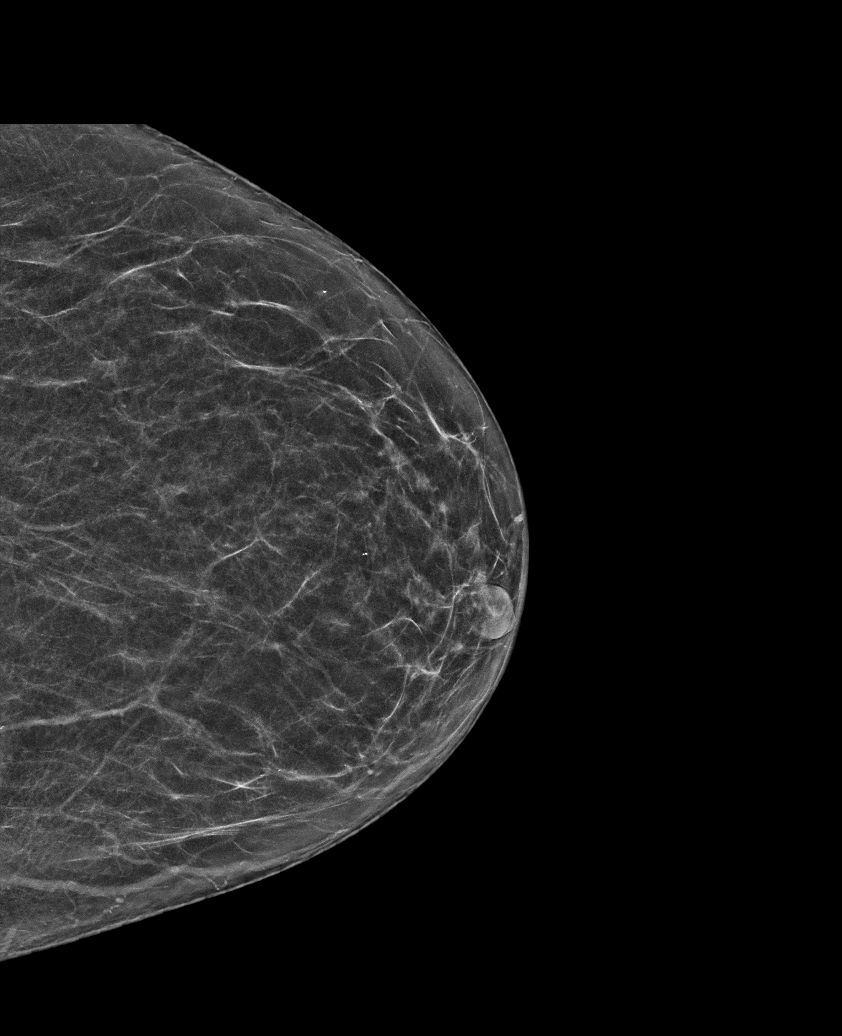

[L CC tomo · tomo slice 31/61.0]
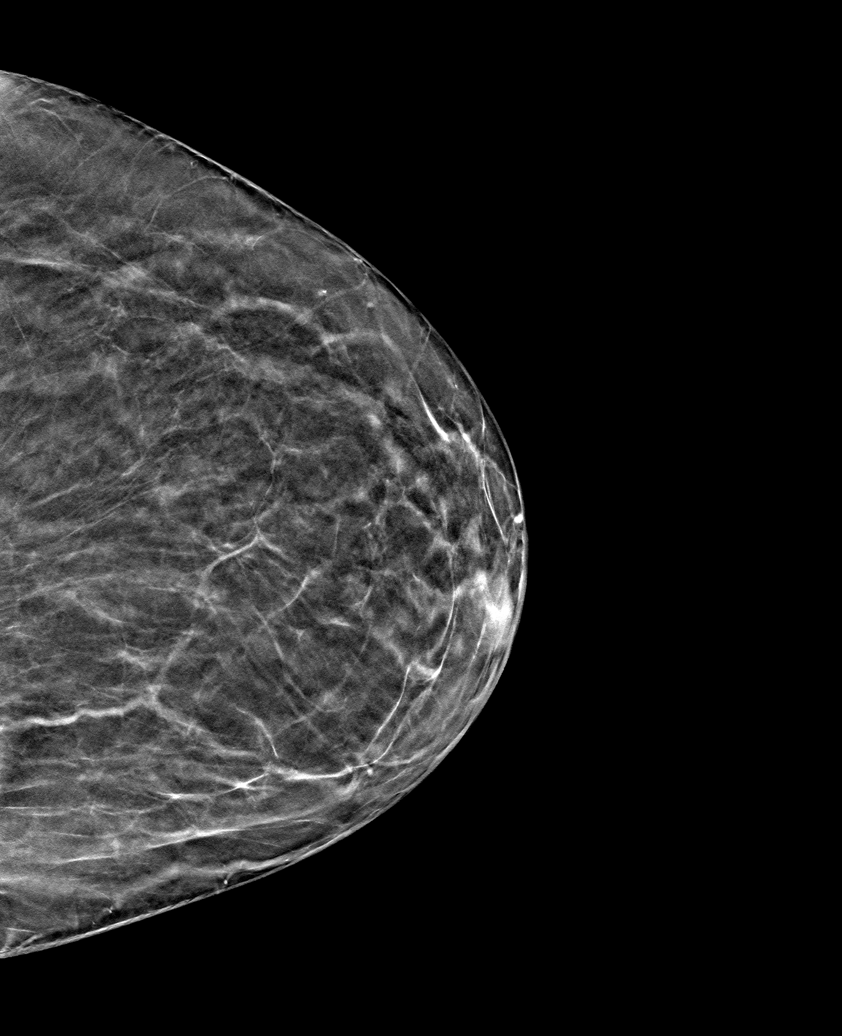

[R MLO tomo · tomo slice 39/78.0]
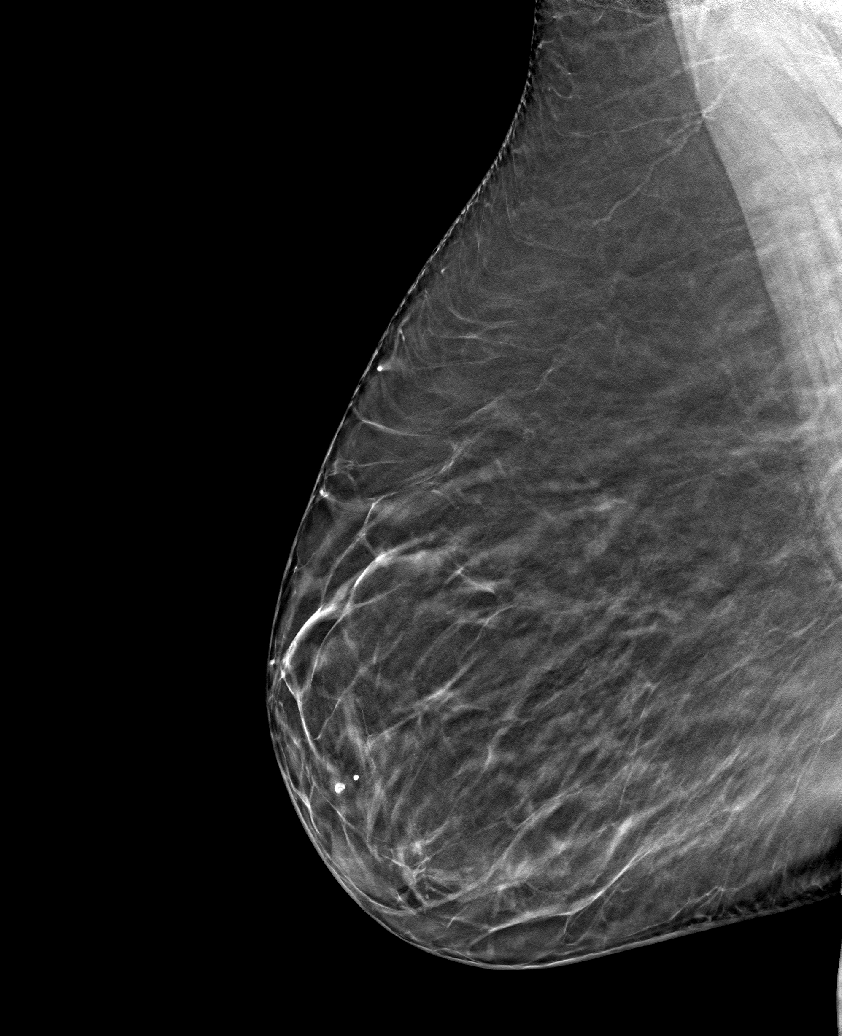

[L MLO tomo · tomo slice 40/79.0]
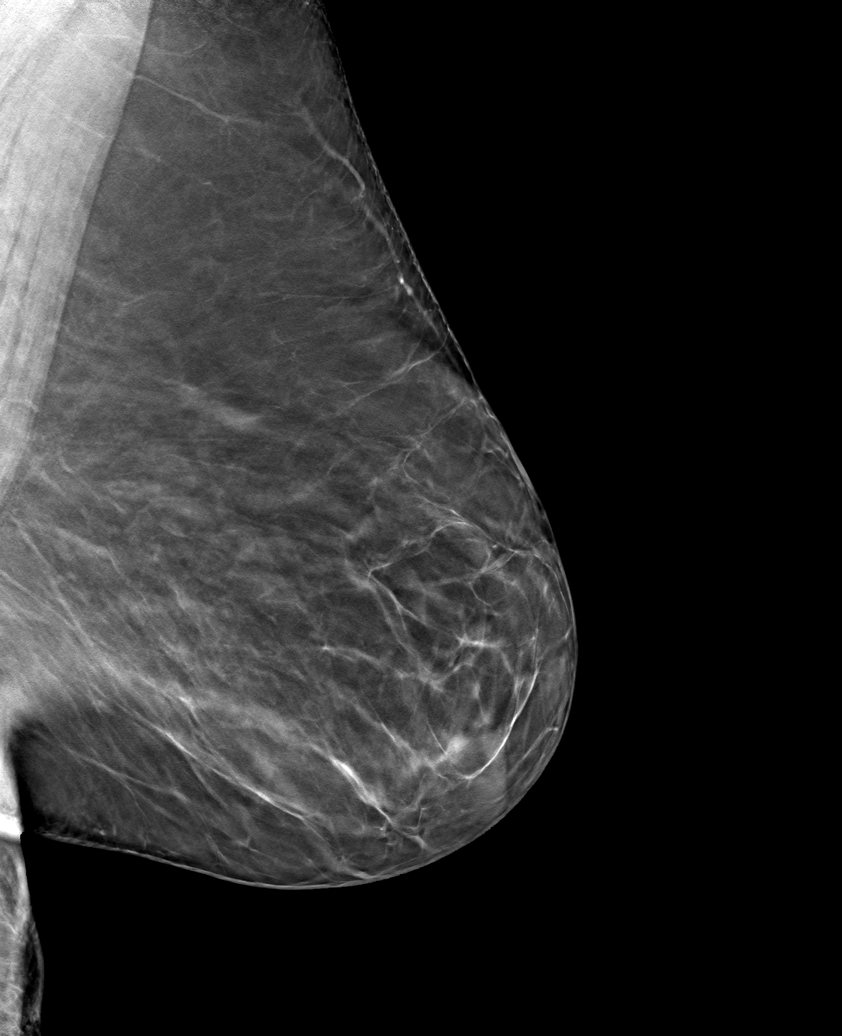

[6 of 18 positions shown; findings below may reference images not displayed]

ACR Breast Density Category b: There are scattered areas of
fibroglandular density.
FINDINGS: There are no findings suspicious for malignancy. Images were
processed with CAD.
IMPRESSION: No mammographic evidence of malignancy. A result letter of this
screening mammogram will be mailed directly to the patient.

RECOMMENDATION:
Screening mammogram in one year. (Code:[id number])

BI-RADS CATEGORY  1: Negative.

## 2018-12-07 DIAGNOSIS — M79671 Pain in right foot: Secondary | ICD-10-CM | POA: Diagnosis not present

## 2018-12-07 DIAGNOSIS — M25571 Pain in right ankle and joints of right foot: Secondary | ICD-10-CM | POA: Diagnosis not present

## 2018-12-07 DIAGNOSIS — M722 Plantar fascial fibromatosis: Secondary | ICD-10-CM | POA: Diagnosis not present

## 2018-12-07 DIAGNOSIS — M19071 Primary osteoarthritis, right ankle and foot: Secondary | ICD-10-CM | POA: Diagnosis not present

## 2018-12-07 DIAGNOSIS — M12271 Villonodular synovitis (pigmented), right ankle and foot: Secondary | ICD-10-CM | POA: Diagnosis not present

## 2018-12-15 DIAGNOSIS — M65871 Other synovitis and tenosynovitis, right ankle and foot: Secondary | ICD-10-CM | POA: Diagnosis not present

## 2018-12-15 DIAGNOSIS — M25571 Pain in right ankle and joints of right foot: Secondary | ICD-10-CM | POA: Diagnosis not present

## 2019-01-02 DIAGNOSIS — M25571 Pain in right ankle and joints of right foot: Secondary | ICD-10-CM | POA: Diagnosis not present

## 2019-04-10 DIAGNOSIS — B351 Tinea unguium: Secondary | ICD-10-CM | POA: Diagnosis not present

## 2019-04-10 DIAGNOSIS — L600 Ingrowing nail: Secondary | ICD-10-CM | POA: Diagnosis not present

## 2019-04-10 DIAGNOSIS — M79675 Pain in left toe(s): Secondary | ICD-10-CM | POA: Diagnosis not present

## 2019-04-10 DIAGNOSIS — B353 Tinea pedis: Secondary | ICD-10-CM | POA: Diagnosis not present

## 2019-04-10 DIAGNOSIS — M79674 Pain in right toe(s): Secondary | ICD-10-CM | POA: Diagnosis not present

## 2019-05-10 DIAGNOSIS — B351 Tinea unguium: Secondary | ICD-10-CM | POA: Diagnosis not present

## 2019-05-10 DIAGNOSIS — M79674 Pain in right toe(s): Secondary | ICD-10-CM | POA: Diagnosis not present

## 2019-05-10 DIAGNOSIS — L600 Ingrowing nail: Secondary | ICD-10-CM | POA: Diagnosis not present

## 2019-05-10 DIAGNOSIS — M7661 Achilles tendinitis, right leg: Secondary | ICD-10-CM | POA: Diagnosis not present

## 2019-05-10 DIAGNOSIS — M79675 Pain in left toe(s): Secondary | ICD-10-CM | POA: Diagnosis not present

## 2019-05-10 DIAGNOSIS — M7671 Peroneal tendinitis, right leg: Secondary | ICD-10-CM | POA: Diagnosis not present

## 2019-05-18 DIAGNOSIS — Z0000 Encounter for general adult medical examination without abnormal findings: Secondary | ICD-10-CM | POA: Diagnosis not present

## 2019-05-18 DIAGNOSIS — I10 Essential (primary) hypertension: Secondary | ICD-10-CM | POA: Diagnosis not present

## 2019-05-18 DIAGNOSIS — E1165 Type 2 diabetes mellitus with hyperglycemia: Secondary | ICD-10-CM | POA: Diagnosis not present

## 2019-05-18 DIAGNOSIS — E1149 Type 2 diabetes mellitus with other diabetic neurological complication: Secondary | ICD-10-CM | POA: Diagnosis not present

## 2019-05-18 DIAGNOSIS — Z1159 Encounter for screening for other viral diseases: Secondary | ICD-10-CM | POA: Diagnosis not present

## 2019-05-18 DIAGNOSIS — E785 Hyperlipidemia, unspecified: Secondary | ICD-10-CM | POA: Diagnosis not present

## 2019-05-18 DIAGNOSIS — E039 Hypothyroidism, unspecified: Secondary | ICD-10-CM | POA: Diagnosis not present

## 2019-05-18 DIAGNOSIS — D509 Iron deficiency anemia, unspecified: Secondary | ICD-10-CM | POA: Diagnosis not present

## 2019-05-18 DIAGNOSIS — E7800 Pure hypercholesterolemia, unspecified: Secondary | ICD-10-CM | POA: Diagnosis not present

## 2019-05-24 DIAGNOSIS — Z1159 Encounter for screening for other viral diseases: Secondary | ICD-10-CM | POA: Diagnosis not present

## 2019-05-24 DIAGNOSIS — E1165 Type 2 diabetes mellitus with hyperglycemia: Secondary | ICD-10-CM | POA: Diagnosis not present

## 2019-05-24 DIAGNOSIS — D509 Iron deficiency anemia, unspecified: Secondary | ICD-10-CM | POA: Diagnosis not present

## 2019-05-24 DIAGNOSIS — E039 Hypothyroidism, unspecified: Secondary | ICD-10-CM | POA: Diagnosis not present

## 2019-05-24 DIAGNOSIS — I10 Essential (primary) hypertension: Secondary | ICD-10-CM | POA: Diagnosis not present

## 2019-05-24 DIAGNOSIS — E1149 Type 2 diabetes mellitus with other diabetic neurological complication: Secondary | ICD-10-CM | POA: Diagnosis not present

## 2019-05-24 DIAGNOSIS — Z7984 Long term (current) use of oral hypoglycemic drugs: Secondary | ICD-10-CM | POA: Diagnosis not present

## 2019-05-24 DIAGNOSIS — E7800 Pure hypercholesterolemia, unspecified: Secondary | ICD-10-CM | POA: Diagnosis not present

## 2019-05-24 DIAGNOSIS — E785 Hyperlipidemia, unspecified: Secondary | ICD-10-CM | POA: Diagnosis not present

## 2019-06-08 DIAGNOSIS — L600 Ingrowing nail: Secondary | ICD-10-CM | POA: Diagnosis not present

## 2019-06-08 DIAGNOSIS — M79674 Pain in right toe(s): Secondary | ICD-10-CM | POA: Diagnosis not present

## 2019-06-08 DIAGNOSIS — B351 Tinea unguium: Secondary | ICD-10-CM | POA: Diagnosis not present

## 2019-06-08 DIAGNOSIS — M79675 Pain in left toe(s): Secondary | ICD-10-CM | POA: Diagnosis not present

## 2019-06-15 DIAGNOSIS — H2513 Age-related nuclear cataract, bilateral: Secondary | ICD-10-CM | POA: Diagnosis not present

## 2019-06-15 DIAGNOSIS — E119 Type 2 diabetes mellitus without complications: Secondary | ICD-10-CM | POA: Diagnosis not present

## 2019-06-25 DIAGNOSIS — R600 Localized edema: Secondary | ICD-10-CM | POA: Diagnosis not present

## 2019-06-25 DIAGNOSIS — D225 Melanocytic nevi of trunk: Secondary | ICD-10-CM | POA: Diagnosis not present

## 2019-06-25 DIAGNOSIS — L814 Other melanin hyperpigmentation: Secondary | ICD-10-CM | POA: Diagnosis not present

## 2019-06-25 DIAGNOSIS — L821 Other seborrheic keratosis: Secondary | ICD-10-CM | POA: Diagnosis not present

## 2019-06-25 DIAGNOSIS — B351 Tinea unguium: Secondary | ICD-10-CM | POA: Diagnosis not present

## 2019-07-06 DIAGNOSIS — M79674 Pain in right toe(s): Secondary | ICD-10-CM | POA: Diagnosis not present

## 2019-07-06 DIAGNOSIS — B351 Tinea unguium: Secondary | ICD-10-CM | POA: Diagnosis not present

## 2019-07-06 DIAGNOSIS — L600 Ingrowing nail: Secondary | ICD-10-CM | POA: Diagnosis not present

## 2019-07-06 DIAGNOSIS — M79675 Pain in left toe(s): Secondary | ICD-10-CM | POA: Diagnosis not present

## 2019-08-22 DIAGNOSIS — E1149 Type 2 diabetes mellitus with other diabetic neurological complication: Secondary | ICD-10-CM | POA: Diagnosis not present

## 2019-08-22 DIAGNOSIS — E785 Hyperlipidemia, unspecified: Secondary | ICD-10-CM | POA: Diagnosis not present

## 2019-08-22 DIAGNOSIS — Z1159 Encounter for screening for other viral diseases: Secondary | ICD-10-CM | POA: Diagnosis not present

## 2019-08-22 DIAGNOSIS — D509 Iron deficiency anemia, unspecified: Secondary | ICD-10-CM | POA: Diagnosis not present

## 2019-08-22 DIAGNOSIS — I10 Essential (primary) hypertension: Secondary | ICD-10-CM | POA: Diagnosis not present

## 2019-08-22 DIAGNOSIS — E7800 Pure hypercholesterolemia, unspecified: Secondary | ICD-10-CM | POA: Diagnosis not present

## 2019-08-22 DIAGNOSIS — E1165 Type 2 diabetes mellitus with hyperglycemia: Secondary | ICD-10-CM | POA: Diagnosis not present

## 2019-08-22 DIAGNOSIS — E039 Hypothyroidism, unspecified: Secondary | ICD-10-CM | POA: Diagnosis not present

## 2019-08-24 DIAGNOSIS — D509 Iron deficiency anemia, unspecified: Secondary | ICD-10-CM | POA: Diagnosis not present

## 2019-08-24 DIAGNOSIS — E1165 Type 2 diabetes mellitus with hyperglycemia: Secondary | ICD-10-CM | POA: Diagnosis not present

## 2019-08-24 DIAGNOSIS — I10 Essential (primary) hypertension: Secondary | ICD-10-CM | POA: Diagnosis not present

## 2019-08-24 DIAGNOSIS — E1149 Type 2 diabetes mellitus with other diabetic neurological complication: Secondary | ICD-10-CM | POA: Diagnosis not present

## 2019-08-24 DIAGNOSIS — E7800 Pure hypercholesterolemia, unspecified: Secondary | ICD-10-CM | POA: Diagnosis not present

## 2019-08-24 DIAGNOSIS — E039 Hypothyroidism, unspecified: Secondary | ICD-10-CM | POA: Diagnosis not present

## 2019-08-24 DIAGNOSIS — Z7984 Long term (current) use of oral hypoglycemic drugs: Secondary | ICD-10-CM | POA: Diagnosis not present

## 2019-11-05 ENCOUNTER — Other Ambulatory Visit: Payer: Self-pay | Admitting: Family Medicine

## 2019-11-05 DIAGNOSIS — Z1231 Encounter for screening mammogram for malignant neoplasm of breast: Secondary | ICD-10-CM

## 2019-11-21 DIAGNOSIS — E1165 Type 2 diabetes mellitus with hyperglycemia: Secondary | ICD-10-CM | POA: Diagnosis not present

## 2019-11-21 DIAGNOSIS — E039 Hypothyroidism, unspecified: Secondary | ICD-10-CM | POA: Diagnosis not present

## 2019-11-21 DIAGNOSIS — E785 Hyperlipidemia, unspecified: Secondary | ICD-10-CM | POA: Diagnosis not present

## 2019-11-21 DIAGNOSIS — Z1159 Encounter for screening for other viral diseases: Secondary | ICD-10-CM | POA: Diagnosis not present

## 2019-11-21 DIAGNOSIS — D509 Iron deficiency anemia, unspecified: Secondary | ICD-10-CM | POA: Diagnosis not present

## 2019-11-21 DIAGNOSIS — E1149 Type 2 diabetes mellitus with other diabetic neurological complication: Secondary | ICD-10-CM | POA: Diagnosis not present

## 2019-11-21 DIAGNOSIS — I10 Essential (primary) hypertension: Secondary | ICD-10-CM | POA: Diagnosis not present

## 2019-11-21 DIAGNOSIS — E7800 Pure hypercholesterolemia, unspecified: Secondary | ICD-10-CM | POA: Diagnosis not present

## 2019-11-26 DIAGNOSIS — Z0000 Encounter for general adult medical examination without abnormal findings: Secondary | ICD-10-CM | POA: Diagnosis not present

## 2019-11-26 DIAGNOSIS — E1165 Type 2 diabetes mellitus with hyperglycemia: Secondary | ICD-10-CM | POA: Diagnosis not present

## 2019-11-26 DIAGNOSIS — E7800 Pure hypercholesterolemia, unspecified: Secondary | ICD-10-CM | POA: Diagnosis not present

## 2019-11-26 DIAGNOSIS — I10 Essential (primary) hypertension: Secondary | ICD-10-CM | POA: Diagnosis not present

## 2019-11-26 DIAGNOSIS — E039 Hypothyroidism, unspecified: Secondary | ICD-10-CM | POA: Diagnosis not present

## 2019-11-26 DIAGNOSIS — E785 Hyperlipidemia, unspecified: Secondary | ICD-10-CM | POA: Diagnosis not present

## 2019-11-26 DIAGNOSIS — Z7984 Long term (current) use of oral hypoglycemic drugs: Secondary | ICD-10-CM | POA: Diagnosis not present

## 2019-11-28 ENCOUNTER — Ambulatory Visit: Payer: Medicare Other

## 2019-12-04 ENCOUNTER — Ambulatory Visit: Payer: Medicare Other

## 2019-12-13 ENCOUNTER — Ambulatory Visit: Payer: Medicare Other | Attending: Internal Medicine

## 2019-12-13 DIAGNOSIS — Z23 Encounter for immunization: Secondary | ICD-10-CM | POA: Insufficient documentation

## 2019-12-13 NOTE — Progress Notes (Signed)
   Covid-19 Vaccination Clinic  Name:  Anne Lang    MRN: [id number] DOB: 01-Nov-1951  12/13/2019  [name] was observed post Covid-19 immunization for 15 minutes without incidence. She was provided with Vaccine Information Sheet and instruction to access the V-Safe system.   [name] was instructed to call 911 with any severe reactions post vaccine: Marland Kitchen Difficulty breathing  . Swelling of your face and throat  . A fast heartbeat  . A bad rash all over your body  . Dizziness and weakness    Immunizations Administered    Name Date Dose VIS Date Route   Pfizer COVID-19 Vaccine 12/13/2019  4:38 PM 0.3 mL 10/19/2019 Intramuscular   Manufacturer: [place]   Lot: [id number]   [place]: [id number]

## 2019-12-17 ENCOUNTER — Ambulatory Visit: Payer: Medicare Other

## 2019-12-20 ENCOUNTER — Ambulatory Visit
Admission: RE | Admit: 2019-12-20 | Discharge: 2019-12-20 | Disposition: A | Discharge status: Home / Self Care | Payer: Medicare Other | Source: Ambulatory Visit | Attending: Family Medicine | Admitting: Family Medicine

## 2019-12-20 ENCOUNTER — Other Ambulatory Visit: Payer: Self-pay

## 2019-12-20 DIAGNOSIS — Z1231 Encounter for screening mammogram for malignant neoplasm of breast: Secondary | ICD-10-CM | POA: Diagnosis not present

## 2020-01-03 ENCOUNTER — Ambulatory Visit: Payer: Medicare Other

## 2020-01-07 ENCOUNTER — Ambulatory Visit: Payer: Medicare Other | Attending: Internal Medicine

## 2020-01-07 DIAGNOSIS — Z23 Encounter for immunization: Secondary | ICD-10-CM | POA: Insufficient documentation

## 2020-01-07 NOTE — Progress Notes (Signed)
   Covid-19 Vaccination Clinic  Name:  Anne Lang    MRN: [id number] DOB: [birth year]  01/07/2020  Anne Lang was observed post Covid-19 immunization for 15 minutes without incidence. She was provided with Vaccine Information Sheet and instruction to access the V-Safe system.   Anne Lang was instructed to call 911 with any severe reactions post vaccine: Marland Kitchen Difficulty breathing  . Swelling of your face and throat  . A fast heartbeat  . A bad rash all over your body  . Dizziness and weakness    Immunizations Administered    Name Date Dose VIS Date Route   Pfizer COVID-19 Vaccine 01/07/2020  3:44 PM 0.3 mL 10/19/2019 Intramuscular   Manufacturer: [place]   Lot: [id number]   [place]: [id number]

## 2020-03-05 DIAGNOSIS — E1165 Type 2 diabetes mellitus with hyperglycemia: Secondary | ICD-10-CM | POA: Diagnosis not present

## 2020-03-07 DIAGNOSIS — E1149 Type 2 diabetes mellitus with other diabetic neurological complication: Secondary | ICD-10-CM | POA: Diagnosis not present

## 2020-03-07 DIAGNOSIS — M170 Bilateral primary osteoarthritis of knee: Secondary | ICD-10-CM | POA: Diagnosis not present

## 2020-03-07 DIAGNOSIS — E1165 Type 2 diabetes mellitus with hyperglycemia: Secondary | ICD-10-CM | POA: Diagnosis not present

## 2020-03-07 DIAGNOSIS — D509 Iron deficiency anemia, unspecified: Secondary | ICD-10-CM | POA: Diagnosis not present

## 2020-03-07 DIAGNOSIS — E039 Hypothyroidism, unspecified: Secondary | ICD-10-CM | POA: Diagnosis not present

## 2020-03-07 DIAGNOSIS — Z7984 Long term (current) use of oral hypoglycemic drugs: Secondary | ICD-10-CM | POA: Diagnosis not present

## 2020-03-07 DIAGNOSIS — E7800 Pure hypercholesterolemia, unspecified: Secondary | ICD-10-CM | POA: Diagnosis not present

## 2020-03-07 DIAGNOSIS — I10 Essential (primary) hypertension: Secondary | ICD-10-CM | POA: Diagnosis not present

## 2020-06-16 DIAGNOSIS — E119 Type 2 diabetes mellitus without complications: Secondary | ICD-10-CM | POA: Diagnosis not present

## 2020-06-16 DIAGNOSIS — H2513 Age-related nuclear cataract, bilateral: Secondary | ICD-10-CM | POA: Diagnosis not present

## 2020-06-16 DIAGNOSIS — H524 Presbyopia: Secondary | ICD-10-CM | POA: Diagnosis not present

## 2020-07-03 DIAGNOSIS — E7800 Pure hypercholesterolemia, unspecified: Secondary | ICD-10-CM | POA: Diagnosis not present

## 2020-07-03 DIAGNOSIS — D509 Iron deficiency anemia, unspecified: Secondary | ICD-10-CM | POA: Diagnosis not present

## 2020-07-03 DIAGNOSIS — E1149 Type 2 diabetes mellitus with other diabetic neurological complication: Secondary | ICD-10-CM | POA: Diagnosis not present

## 2020-07-03 DIAGNOSIS — I10 Essential (primary) hypertension: Secondary | ICD-10-CM | POA: Diagnosis not present

## 2020-07-03 DIAGNOSIS — M170 Bilateral primary osteoarthritis of knee: Secondary | ICD-10-CM | POA: Diagnosis not present

## 2020-07-03 DIAGNOSIS — E1165 Type 2 diabetes mellitus with hyperglycemia: Secondary | ICD-10-CM | POA: Diagnosis not present

## 2020-07-03 DIAGNOSIS — E039 Hypothyroidism, unspecified: Secondary | ICD-10-CM | POA: Diagnosis not present

## 2020-08-26 DIAGNOSIS — D509 Iron deficiency anemia, unspecified: Secondary | ICD-10-CM | POA: Diagnosis not present

## 2020-08-26 DIAGNOSIS — E7800 Pure hypercholesterolemia, unspecified: Secondary | ICD-10-CM | POA: Diagnosis not present

## 2020-08-26 DIAGNOSIS — E1165 Type 2 diabetes mellitus with hyperglycemia: Secondary | ICD-10-CM | POA: Diagnosis not present

## 2020-08-26 DIAGNOSIS — E1149 Type 2 diabetes mellitus with other diabetic neurological complication: Secondary | ICD-10-CM | POA: Diagnosis not present

## 2020-08-26 DIAGNOSIS — I10 Essential (primary) hypertension: Secondary | ICD-10-CM | POA: Diagnosis not present

## 2020-08-26 DIAGNOSIS — M170 Bilateral primary osteoarthritis of knee: Secondary | ICD-10-CM | POA: Diagnosis not present

## 2020-08-26 DIAGNOSIS — E039 Hypothyroidism, unspecified: Secondary | ICD-10-CM | POA: Diagnosis not present

## 2020-09-06 ENCOUNTER — Ambulatory Visit: Payer: Medicare Other | Attending: Internal Medicine

## 2020-09-06 DIAGNOSIS — Z23 Encounter for immunization: Secondary | ICD-10-CM

## 2020-09-06 NOTE — Progress Notes (Signed)
   Covid-19 Vaccination Clinic  Name:  Chaka Boyson    MRN: [id number] DOB: [birth year]  09/06/2020  Ms. Biasi was observed post Covid-19 immunization for 15 minutes without incident. She was provided with Vaccine Information Sheet and instruction to access the V-Safe system.   Ms. Loss was instructed to call 911 with any severe reactions post vaccine: Marland Kitchen Difficulty breathing  . Swelling of face and throat  . A fast heartbeat  . A bad rash all over body  . Dizziness and weakness

## 2020-10-16 DIAGNOSIS — Z20822 Contact with and (suspected) exposure to covid-19: Secondary | ICD-10-CM | POA: Diagnosis not present

## 2020-10-21 DIAGNOSIS — E039 Hypothyroidism, unspecified: Secondary | ICD-10-CM | POA: Diagnosis not present

## 2020-10-21 DIAGNOSIS — D509 Iron deficiency anemia, unspecified: Secondary | ICD-10-CM | POA: Diagnosis not present

## 2020-10-21 DIAGNOSIS — E1165 Type 2 diabetes mellitus with hyperglycemia: Secondary | ICD-10-CM | POA: Diagnosis not present

## 2020-10-21 DIAGNOSIS — M170 Bilateral primary osteoarthritis of knee: Secondary | ICD-10-CM | POA: Diagnosis not present

## 2020-10-21 DIAGNOSIS — E7800 Pure hypercholesterolemia, unspecified: Secondary | ICD-10-CM | POA: Diagnosis not present

## 2020-10-21 DIAGNOSIS — I10 Essential (primary) hypertension: Secondary | ICD-10-CM | POA: Diagnosis not present

## 2020-10-21 DIAGNOSIS — E1149 Type 2 diabetes mellitus with other diabetic neurological complication: Secondary | ICD-10-CM | POA: Diagnosis not present

## 2020-11-11 ENCOUNTER — Other Ambulatory Visit: Payer: Self-pay | Admitting: Family Medicine

## 2020-11-11 DIAGNOSIS — Z1231 Encounter for screening mammogram for malignant neoplasm of breast: Secondary | ICD-10-CM

## 2020-12-16 DIAGNOSIS — E1149 Type 2 diabetes mellitus with other diabetic neurological complication: Secondary | ICD-10-CM | POA: Diagnosis not present

## 2020-12-16 DIAGNOSIS — E1165 Type 2 diabetes mellitus with hyperglycemia: Secondary | ICD-10-CM | POA: Diagnosis not present

## 2020-12-16 DIAGNOSIS — E039 Hypothyroidism, unspecified: Secondary | ICD-10-CM | POA: Diagnosis not present

## 2020-12-16 DIAGNOSIS — I10 Essential (primary) hypertension: Secondary | ICD-10-CM | POA: Diagnosis not present

## 2020-12-16 DIAGNOSIS — H269 Unspecified cataract: Secondary | ICD-10-CM | POA: Diagnosis not present

## 2020-12-16 DIAGNOSIS — E7800 Pure hypercholesterolemia, unspecified: Secondary | ICD-10-CM | POA: Diagnosis not present

## 2020-12-16 DIAGNOSIS — D509 Iron deficiency anemia, unspecified: Secondary | ICD-10-CM | POA: Diagnosis not present

## 2020-12-16 DIAGNOSIS — M170 Bilateral primary osteoarthritis of knee: Secondary | ICD-10-CM | POA: Diagnosis not present

## 2020-12-22 ENCOUNTER — Ambulatory Visit
Admission: RE | Admit: 2020-12-22 | Discharge: 2020-12-22 | Disposition: A | Discharge status: Home / Self Care | Payer: Medicare Other | Source: Ambulatory Visit | Attending: Family Medicine | Admitting: Family Medicine

## 2020-12-22 ENCOUNTER — Other Ambulatory Visit: Payer: Self-pay

## 2020-12-22 DIAGNOSIS — Z1231 Encounter for screening mammogram for malignant neoplasm of breast: Secondary | ICD-10-CM | POA: Diagnosis not present

## 2021-01-02 DIAGNOSIS — E039 Hypothyroidism, unspecified: Secondary | ICD-10-CM | POA: Diagnosis not present

## 2021-01-02 DIAGNOSIS — E7800 Pure hypercholesterolemia, unspecified: Secondary | ICD-10-CM | POA: Diagnosis not present

## 2021-01-02 DIAGNOSIS — E1165 Type 2 diabetes mellitus with hyperglycemia: Secondary | ICD-10-CM | POA: Diagnosis not present

## 2021-01-02 DIAGNOSIS — I10 Essential (primary) hypertension: Secondary | ICD-10-CM | POA: Diagnosis not present

## 2021-01-06 DIAGNOSIS — E039 Hypothyroidism, unspecified: Secondary | ICD-10-CM | POA: Diagnosis not present

## 2021-01-06 DIAGNOSIS — H269 Unspecified cataract: Secondary | ICD-10-CM | POA: Diagnosis not present

## 2021-01-06 DIAGNOSIS — I10 Essential (primary) hypertension: Secondary | ICD-10-CM | POA: Diagnosis not present

## 2021-01-06 DIAGNOSIS — Z0000 Encounter for general adult medical examination without abnormal findings: Secondary | ICD-10-CM | POA: Diagnosis not present

## 2021-01-06 DIAGNOSIS — E7800 Pure hypercholesterolemia, unspecified: Secondary | ICD-10-CM | POA: Diagnosis not present

## 2021-01-06 DIAGNOSIS — H9202 Otalgia, left ear: Secondary | ICD-10-CM | POA: Diagnosis not present

## 2021-01-06 DIAGNOSIS — E1165 Type 2 diabetes mellitus with hyperglycemia: Secondary | ICD-10-CM | POA: Diagnosis not present

## 2021-01-06 DIAGNOSIS — M79669 Pain in unspecified lower leg: Secondary | ICD-10-CM | POA: Diagnosis not present

## 2021-01-06 DIAGNOSIS — Z7984 Long term (current) use of oral hypoglycemic drugs: Secondary | ICD-10-CM | POA: Diagnosis not present

## 2021-01-06 DIAGNOSIS — D509 Iron deficiency anemia, unspecified: Secondary | ICD-10-CM | POA: Diagnosis not present

## 2021-02-04 DIAGNOSIS — E1165 Type 2 diabetes mellitus with hyperglycemia: Secondary | ICD-10-CM | POA: Diagnosis not present

## 2021-02-04 DIAGNOSIS — D509 Iron deficiency anemia, unspecified: Secondary | ICD-10-CM | POA: Diagnosis not present

## 2021-02-04 DIAGNOSIS — H269 Unspecified cataract: Secondary | ICD-10-CM | POA: Diagnosis not present

## 2021-02-04 DIAGNOSIS — E1149 Type 2 diabetes mellitus with other diabetic neurological complication: Secondary | ICD-10-CM | POA: Diagnosis not present

## 2021-02-04 DIAGNOSIS — I10 Essential (primary) hypertension: Secondary | ICD-10-CM | POA: Diagnosis not present

## 2021-02-04 DIAGNOSIS — E039 Hypothyroidism, unspecified: Secondary | ICD-10-CM | POA: Diagnosis not present

## 2021-02-04 DIAGNOSIS — E7800 Pure hypercholesterolemia, unspecified: Secondary | ICD-10-CM | POA: Diagnosis not present

## 2021-02-04 DIAGNOSIS — M170 Bilateral primary osteoarthritis of knee: Secondary | ICD-10-CM | POA: Diagnosis not present

## 2021-03-04 DIAGNOSIS — M25532 Pain in left wrist: Secondary | ICD-10-CM | POA: Diagnosis not present

## 2021-03-04 DIAGNOSIS — M25531 Pain in right wrist: Secondary | ICD-10-CM | POA: Diagnosis not present

## 2021-04-01 DIAGNOSIS — H269 Unspecified cataract: Secondary | ICD-10-CM | POA: Diagnosis not present

## 2021-04-01 DIAGNOSIS — D509 Iron deficiency anemia, unspecified: Secondary | ICD-10-CM | POA: Diagnosis not present

## 2021-04-01 DIAGNOSIS — E7800 Pure hypercholesterolemia, unspecified: Secondary | ICD-10-CM | POA: Diagnosis not present

## 2021-04-01 DIAGNOSIS — E039 Hypothyroidism, unspecified: Secondary | ICD-10-CM | POA: Diagnosis not present

## 2021-04-01 DIAGNOSIS — E1165 Type 2 diabetes mellitus with hyperglycemia: Secondary | ICD-10-CM | POA: Diagnosis not present

## 2021-04-01 DIAGNOSIS — E1149 Type 2 diabetes mellitus with other diabetic neurological complication: Secondary | ICD-10-CM | POA: Diagnosis not present

## 2021-04-01 DIAGNOSIS — M170 Bilateral primary osteoarthritis of knee: Secondary | ICD-10-CM | POA: Diagnosis not present

## 2021-04-01 DIAGNOSIS — I10 Essential (primary) hypertension: Secondary | ICD-10-CM | POA: Diagnosis not present

## 2021-06-18 DIAGNOSIS — H2513 Age-related nuclear cataract, bilateral: Secondary | ICD-10-CM | POA: Diagnosis not present

## 2021-06-18 DIAGNOSIS — L814 Other melanin hyperpigmentation: Secondary | ICD-10-CM | POA: Diagnosis not present

## 2021-06-18 DIAGNOSIS — L578 Other skin changes due to chronic exposure to nonionizing radiation: Secondary | ICD-10-CM | POA: Diagnosis not present

## 2021-06-18 DIAGNOSIS — L821 Other seborrheic keratosis: Secondary | ICD-10-CM | POA: Diagnosis not present

## 2021-06-18 DIAGNOSIS — L738 Other specified follicular disorders: Secondary | ICD-10-CM | POA: Diagnosis not present

## 2021-06-18 DIAGNOSIS — D225 Melanocytic nevi of trunk: Secondary | ICD-10-CM | POA: Diagnosis not present

## 2021-06-18 DIAGNOSIS — H5203 Hypermetropia, bilateral: Secondary | ICD-10-CM | POA: Diagnosis not present

## 2021-07-20 DIAGNOSIS — E7800 Pure hypercholesterolemia, unspecified: Secondary | ICD-10-CM | POA: Diagnosis not present

## 2021-07-20 DIAGNOSIS — E039 Hypothyroidism, unspecified: Secondary | ICD-10-CM | POA: Diagnosis not present

## 2021-07-20 DIAGNOSIS — D509 Iron deficiency anemia, unspecified: Secondary | ICD-10-CM | POA: Diagnosis not present

## 2021-07-20 DIAGNOSIS — E1169 Type 2 diabetes mellitus with other specified complication: Secondary | ICD-10-CM | POA: Diagnosis not present

## 2021-07-20 DIAGNOSIS — I10 Essential (primary) hypertension: Secondary | ICD-10-CM | POA: Diagnosis not present

## 2021-07-21 DIAGNOSIS — H2513 Age-related nuclear cataract, bilateral: Secondary | ICD-10-CM | POA: Diagnosis not present

## 2021-08-07 DIAGNOSIS — Z23 Encounter for immunization: Secondary | ICD-10-CM | POA: Diagnosis not present

## 2021-09-01 DIAGNOSIS — D509 Iron deficiency anemia, unspecified: Secondary | ICD-10-CM | POA: Diagnosis not present

## 2021-09-01 DIAGNOSIS — E1169 Type 2 diabetes mellitus with other specified complication: Secondary | ICD-10-CM | POA: Diagnosis not present

## 2021-09-01 DIAGNOSIS — M170 Bilateral primary osteoarthritis of knee: Secondary | ICD-10-CM | POA: Diagnosis not present

## 2021-09-01 DIAGNOSIS — E1149 Type 2 diabetes mellitus with other diabetic neurological complication: Secondary | ICD-10-CM | POA: Diagnosis not present

## 2021-09-01 DIAGNOSIS — E7800 Pure hypercholesterolemia, unspecified: Secondary | ICD-10-CM | POA: Diagnosis not present

## 2021-09-01 DIAGNOSIS — H269 Unspecified cataract: Secondary | ICD-10-CM | POA: Diagnosis not present

## 2021-09-01 DIAGNOSIS — I10 Essential (primary) hypertension: Secondary | ICD-10-CM | POA: Diagnosis not present

## 2021-09-01 DIAGNOSIS — E1165 Type 2 diabetes mellitus with hyperglycemia: Secondary | ICD-10-CM | POA: Diagnosis not present

## 2021-09-01 DIAGNOSIS — E039 Hypothyroidism, unspecified: Secondary | ICD-10-CM | POA: Diagnosis not present

## 2021-09-09 DIAGNOSIS — H21561 Pupillary abnormality, right eye: Secondary | ICD-10-CM | POA: Diagnosis not present

## 2021-09-09 DIAGNOSIS — H25811 Combined forms of age-related cataract, right eye: Secondary | ICD-10-CM | POA: Diagnosis not present

## 2021-09-09 DIAGNOSIS — H268 Other specified cataract: Secondary | ICD-10-CM | POA: Diagnosis not present

## 2021-09-09 DIAGNOSIS — H2511 Age-related nuclear cataract, right eye: Secondary | ICD-10-CM | POA: Diagnosis not present

## 2021-10-14 DIAGNOSIS — H25812 Combined forms of age-related cataract, left eye: Secondary | ICD-10-CM | POA: Diagnosis not present

## 2021-10-14 DIAGNOSIS — H2512 Age-related nuclear cataract, left eye: Secondary | ICD-10-CM | POA: Diagnosis not present

## 2021-11-03 DIAGNOSIS — E1165 Type 2 diabetes mellitus with hyperglycemia: Secondary | ICD-10-CM | POA: Diagnosis not present

## 2021-11-03 DIAGNOSIS — E1149 Type 2 diabetes mellitus with other diabetic neurological complication: Secondary | ICD-10-CM | POA: Diagnosis not present

## 2021-11-03 DIAGNOSIS — I10 Essential (primary) hypertension: Secondary | ICD-10-CM | POA: Diagnosis not present

## 2021-11-03 DIAGNOSIS — M170 Bilateral primary osteoarthritis of knee: Secondary | ICD-10-CM | POA: Diagnosis not present

## 2021-11-03 DIAGNOSIS — E039 Hypothyroidism, unspecified: Secondary | ICD-10-CM | POA: Diagnosis not present

## 2021-11-03 DIAGNOSIS — E7800 Pure hypercholesterolemia, unspecified: Secondary | ICD-10-CM | POA: Diagnosis not present

## 2021-11-03 DIAGNOSIS — E1169 Type 2 diabetes mellitus with other specified complication: Secondary | ICD-10-CM | POA: Diagnosis not present

## 2021-11-16 ENCOUNTER — Other Ambulatory Visit: Payer: Self-pay | Admitting: Family Medicine

## 2021-11-16 DIAGNOSIS — Z1231 Encounter for screening mammogram for malignant neoplasm of breast: Secondary | ICD-10-CM

## 2021-12-21 DIAGNOSIS — M25531 Pain in right wrist: Secondary | ICD-10-CM | POA: Diagnosis not present

## 2021-12-24 ENCOUNTER — Ambulatory Visit
Admission: RE | Admit: 2021-12-24 | Discharge: 2021-12-24 | Disposition: A | Discharge status: Home / Self Care | Payer: Medicare Other | Source: Ambulatory Visit | Attending: Family Medicine | Admitting: Family Medicine

## 2021-12-24 ENCOUNTER — Ambulatory Visit: Payer: Medicare Other

## 2021-12-24 DIAGNOSIS — Z1231 Encounter for screening mammogram for malignant neoplasm of breast: Secondary | ICD-10-CM

## 2021-12-30 DIAGNOSIS — M25531 Pain in right wrist: Secondary | ICD-10-CM | POA: Diagnosis not present

## 2022-01-19 DIAGNOSIS — M25531 Pain in right wrist: Secondary | ICD-10-CM | POA: Diagnosis not present

## 2022-01-25 DIAGNOSIS — I499 Cardiac arrhythmia, unspecified: Secondary | ICD-10-CM | POA: Diagnosis not present

## 2022-01-25 DIAGNOSIS — E1169 Type 2 diabetes mellitus with other specified complication: Secondary | ICD-10-CM | POA: Diagnosis not present

## 2022-01-25 DIAGNOSIS — E7800 Pure hypercholesterolemia, unspecified: Secondary | ICD-10-CM | POA: Diagnosis not present

## 2022-01-25 DIAGNOSIS — D509 Iron deficiency anemia, unspecified: Secondary | ICD-10-CM | POA: Diagnosis not present

## 2022-01-25 DIAGNOSIS — E039 Hypothyroidism, unspecified: Secondary | ICD-10-CM | POA: Diagnosis not present

## 2022-01-25 DIAGNOSIS — M170 Bilateral primary osteoarthritis of knee: Secondary | ICD-10-CM | POA: Diagnosis not present

## 2022-01-25 DIAGNOSIS — I4892 Unspecified atrial flutter: Secondary | ICD-10-CM | POA: Diagnosis not present

## 2022-01-25 DIAGNOSIS — Z0000 Encounter for general adult medical examination without abnormal findings: Secondary | ICD-10-CM | POA: Diagnosis not present

## 2022-01-25 DIAGNOSIS — I10 Essential (primary) hypertension: Secondary | ICD-10-CM | POA: Diagnosis not present

## 2022-02-01 ENCOUNTER — Encounter: Payer: Self-pay | Admitting: Cardiology

## 2022-02-01 ENCOUNTER — Ambulatory Visit: Payer: Medicare Other | Admitting: Cardiology

## 2022-02-01 ENCOUNTER — Other Ambulatory Visit: Payer: Self-pay

## 2022-02-01 VITALS — BP 129/79 | HR 94 | Temp 97.7°F | Resp 16 | Ht 65.0 in | Wt 235.0 lb

## 2022-02-01 DIAGNOSIS — Z7901 Long term (current) use of anticoagulants: Secondary | ICD-10-CM | POA: Diagnosis not present

## 2022-02-01 DIAGNOSIS — I4819 Other persistent atrial fibrillation: Secondary | ICD-10-CM | POA: Diagnosis not present

## 2022-02-01 DIAGNOSIS — E119 Type 2 diabetes mellitus without complications: Secondary | ICD-10-CM | POA: Diagnosis not present

## 2022-02-01 DIAGNOSIS — I10 Essential (primary) hypertension: Secondary | ICD-10-CM

## 2022-02-01 MED ORDER — FUROSEMIDE 20 MG PO TABS: 10.0000 mg | ORAL_TABLET | ORAL | 0 refills | Status: DC

## 2022-02-01 NOTE — Progress Notes (Signed)
? ?Date:  02/01/2022  ? ?ID:  Anne Lang, DOB Nov 28, 1950, MRN [id number] ? ?PCP:  Aretta Nip, MD  ?Cardiologist:  Rex Kras, DO, [facility] (established care 02/01/2022) ? ?REASON FOR CONSULT: Atrial Fibrillation.  ? ?REQUESTING PHYSICIAN:  ?Rankins, Bill Salinas, MD ?[address] ?[place],  [place] [address] ? ?Chief Complaint  ?Patient presents with  ? New Patient (Initial Visit)  ?  Atrial flutter/fibrillation evaluation  ? ? ?HPI  ?Anne Lang is a 71 y.o. Caucasian female who presents to the office with a chief complaint of " atrial fibrillation management." Patient's past medical history and cardiovascular risk factors include: Hyperlipidemia, hypertension, diabetes melitis type II, persistent atrial fibrillation. ? ?She is referred to the office at the request of Rankins, Bill Salinas, MD for evaluation of atrial flutter/fibrillation.. ? ?Patient had her second cataract surgery in December 2022 and at that time she was told that her heart is fluttering based on the EKG.  She did not seek medical attention at that time but when she was due for her yearly physical on March 20th 2023 she was noted to be in A-fib, started on metoprolol and Eliquis and now referred to cardiology for further evaluation and management. ? ?Patient brings her EKG from her second cataract surgery which was in December 2022 but EKG is dated 09/20/2021 underlying rhythm is atrial flutter at 79 bpm.  EKG provided by her PCP from 01/25/2022 notes atrial fibrillation 106 bpm (see media section).   ? ?Patient denies any chest pain or heart failure symptoms.  She continues to swim 3 times a week at the [facility] and enjoys bowling. ? ?FUNCTIONAL STATUS: ?Swimming three days a week at [facility] and bowling.   ? ?ALLERGIES: ?Allergies  ?Allergen Reactions  ? Empagliflozin   ?  Other reaction(s): Yeast infections, itching  ? Pioglitazone   ?  Other reaction(s): dependent edema  ? Sulfa Antibiotics   ?  unknown  ? ? ?MEDICATION LIST  PRIOR TO VISIT: ?Current Meds  ?Medication Sig  ? apixaban (ELIQUIS) 5 MG TABS tablet Take 5 mg by mouth 2 (two) times daily.  ? Ascorbic Acid (VITAMIN C PO) Take by mouth.  ? atorvastatin (LIPITOR) 10 MG tablet Take 5 mg by mouth daily.  ? Calcium Carbonate-Vit D-Min (CALCIUM 1200 PO) Take by mouth.  ? diclofenac (VOLTAREN) 75 MG EC tablet Take 1 tablet (75 mg total) by mouth 2 (two) times daily.  ? Dulaglutide (TRULICITY ) Inject into the skin.  ? ferrous sulfate 325 (65 FE) MG tablet Take 325 mg by mouth daily with breakfast.  ? furosemide (LASIX) 20 MG tablet Take 0.5 tablets (10 mg total) by mouth every morning.  ? hydrochlorothiazide (HYDRODIURIL) 25 MG tablet Take 25 mg by mouth every morning.  ? levothyroxine (SYNTHROID, LEVOTHROID) 150 MCG tablet Take 150 mcg by mouth daily before breakfast.  ? metFORMIN (GLUCOPHAGE) 1000 MG tablet Take 1,000 mg by mouth 2 (two) times daily with a meal.  ? metoprolol succinate (TOPROL-XL) 50 MG 24 hr tablet Take 50 mg by mouth daily.  ? Multiple Vitamins-Minerals (CENTRUM SILVER PO) Take by mouth.  ? ramipril (ALTACE) 10 MG capsule Take 10 mg by mouth every morning.  ?  ? ?PAST MEDICAL HISTORY: ?Past Medical History:  ?Diagnosis Date  ? Arthritis   ? Diabetes mellitus without complication ([place])   ? Hemorrhoids   ? Hyperlipidemia   ? Hypertension   ? Hypothyroidism   ? Impingement syndrome of right shoulder   ? ? ?  PAST SURGICAL HISTORY: ?Past Surgical History:  ?Procedure Laterality Date  ? colonscopy     ? ingrown toenail    ? 1969   ? TONSILLECTOMY    ? TOTAL KNEE ARTHROPLASTY Left 12/09/2014  ? Procedure: LEFT TOTAL KNEE ARTHROPLASTY;  Surgeon: [name], MD;  Location: [facility];  Service: Orthopedics;  Laterality: Left;  ? trauma to left thumb    ? with surgical repair   ? TUBAL LIGATION    ? ? ?FAMILY HISTORY: ?The patient family history includes Arthritis/Rheumatoid in her sister; Atrial fibrillation in her sister; Congestive Heart Failure in her mother; Emphysema  in her mother; Heart attack in her brother; Heart attack (age of onset: 71) in her father; Hypertension in her brother; Other in her brother and sister. ? ?SOCIAL HISTORY:  ?The patient  reports that she has never smoked. She has never used smokeless tobacco. She reports that she does not drink alcohol and does not use drugs. ? ?REVIEW OF SYSTEMS: ?Review of Systems  ?Constitutional: Positive for malaise/fatigue.  ?Cardiovascular:  Negative for chest pain, dyspnea on exertion, leg swelling, palpitations and syncope.  ?Respiratory:  Negative for shortness of breath.   ? ?PHYSICAL EXAM: ? ?  02/01/2022  ? 10:56 AM 12/01/2017  ?  8:23 AM 06/03/2016  ?  7:59 AM  ?Vitals with BMI  ?Height '5\' 5"'$   '5\' 5"'$   ?Weight 235 lbs  236 lbs  ?BMI 39.11  39.4  ?Systolic 160 737   ?Diastolic 79 74   ?Pulse 94 92   ? ?CONSTITUTIONAL: Age-appropriate female, hemodynamically stable,  No acute distress.  ?SKIN: Skin is warm and dry. No rash noted. No cyanosis. No pallor. No jaundice ?HEAD: Normocephalic and atraumatic.  ?EYES: No scleral icterus ?MOUTH/THROAT: Moist oral membranes.  ?NECK: No JVD present. No thyromegaly noted. No carotid bruits  ?LYMPHATIC: No visible cervical adenopathy.  ?CHEST Normal respiratory effort. No intercostal retractions  ?LUNGS: Clear to auscultation bilaterally.  No stridor. No wheezes. No rales.  ?CARDIOVASCULAR: Irregularly irregular, variable S1-S2, no murmurs rubs or gallops appreciated secondary to ventricular rate. ?ABDOMINAL: Obese, soft, nontender, nondistended, positive bowel sounds in all 4 quadrants, no apparent ascites.  ?EXTREMITIES: 1+ bilateral peripheral edema, warm to touch, 2+ DP PT pulses. ?HEMATOLOGIC: No significant bruising ?NEUROLOGIC: Oriented to person, place, and time. Nonfocal. Normal muscle tone.  ?PSYCHIATRIC: Normal mood and affect. Normal behavior. Cooperative ? ?CARDIAC DATABASE: ?EKG: ?02/01/2022: Atrial fibrillation, 74 bpm, normal axis, without underlying injury  pattern. ? ?Echocardiogram: ?No results found for this or any previous visit from the past 1095 days. ? ?Stress Testing: ?No results found for this or any previous visit from the past 1095 days. ? ?Heart Catheterization: ?None ? ?LABORATORY DATA: ? ?  Latest Ref Rng & Units 12/11/2014  ?  5:18 AM 12/10/2014  ?  5:00 AM 12/02/2014  ?  9:05 AM  ?CBC  ?WBC 4.0 - 10.5 K/uL 10.7   10.5   9.9    ?Hemoglobin 12.0 - 15.0 g/dL 10.9   11.0   13.6    ?Hematocrit 36.0 - 46.0 % 35.4   36.2   43.9    ?Platelets 150 - 400 K/uL 175   172   182    ? ? ? ?  Latest Ref Rng & Units 12/11/2014  ?  5:18 AM 12/10/2014  ?  5:00 AM 12/02/2014  ?  9:05 AM  ?CMP  ?Glucose 70 - 99 mg/dL 167   157   167    ?  BUN 6 - 23 mg/dL '16   14   13    '$ ?Creatinine 0.50 - 1.10 mg/dL 0.73   0.92   0.80    ?Sodium 135 - 145 mmol/L 138   137   139    ?Potassium 3.5 - 5.1 mmol/L 3.7   3.7   3.9    ?Chloride 96 - 112 mmol/L 104   98   100    ?CO2 19 - 32 mmol/L '28   29   30    '$ ?Calcium 8.4 - 10.5 mg/dL 9.0   8.8   9.8    ?Total Protein 6.0 - 8.3 g/dL   6.9    ?Total Bilirubin 0.3 - 1.2 mg/dL   0.4    ?Alkaline Phos 39 - 117 U/L   80    ?AST 0 - 37 U/L   29    ?ALT 0 - 35 U/L   33    ? ? ?Lipid Panel  ?No results found for: CHOL, TRIG, HDL, CHOLHDL, VLDL, LDLCALC, LDLDIRECT, LABVLDL ? ?No components found for: NTPROBNP ?No results for input(s): PROBNP in the last 8760 hours. ?No results for input(s): TSH in the last 8760 hours. ? ?BMP ?No results for input(s): NA, K, CL, CO2, GLUCOSE, BUN, CREATININE, CALCIUM, GFRNONAA, GFRAA in the last 8760 hours. ? ?HEMOGLOBIN A1C ?No results found for: HGBA1C, MPG ? ?External Labs: ?Collected: 01/25/2022. ?Hemoglobin 14.4 g/dL, hematocrit 43.9%. ?BUN 14, creatinine 0.82 mg/dL. ?Sodium 141, potassium 4, chloride 102, bicarb 31. ?AST 21, ALT 22, alkaline phosphatase 69 ?Hemoglobin A1c 8.4. ?Total cholesterol 161, triglycerides 132, HDL 59, LDL 79, non-HDL 102. ?TSH 0.94 ? ? ?IMPRESSION: ? ?  ICD-10-CM   ?1. Persistent atrial fibrillation  (HCC)  I48.19 EKG 12-Lead  ?  furosemide (LASIX) 20 MG tablet  ?  Basic metabolic panel  ?  Hemoglobin and hematocrit, blood  ?  Magnesium  ?  ?2. Long term (current) use of anticoagulants  C58.85 Basic metabolic panel  ?

## 2022-02-01 NOTE — H&P (View-Only) (Signed)
? ?Date:  02/01/2022  ? ?ID:  Anne Lang, DOB 1951/04/10, MRN [id number] ? ?PCP:  Aretta Nip, MD  ?Cardiologist:  Rex Kras, DO, [facility] (established care 02/01/2022) ? ?REASON FOR CONSULT: Atrial Fibrillation.  ? ?REQUESTING PHYSICIAN:  ?Rankins, Bill Salinas, MD ?[address] ?[place],  [place] [address] ? ?Chief Complaint  ?Patient presents with  ? New Patient (Initial Visit)  ?  Atrial flutter/fibrillation evaluation  ? ? ?HPI  ?Anne Lang is a 71 y.o. Caucasian female who presents to the office with a chief complaint of " atrial fibrillation management." Patient's past medical history and cardiovascular risk factors include: Hyperlipidemia, hypertension, diabetes melitis type II, persistent atrial fibrillation. ? ?She is referred to the office at the request of Rankins, Bill Salinas, MD for evaluation of atrial flutter/fibrillation.. ? ?Patient had her second cataract surgery in December 2022 and at that time she was told that her heart is fluttering based on the EKG.  She did not seek medical attention at that time but when she was due for her yearly physical on March 20th 2023 she was noted to be in A-fib, started on metoprolol and Eliquis and now referred to cardiology for further evaluation and management. ? ?Patient brings her EKG from her second cataract surgery which was in December 2022 but EKG is dated 09/20/2021 underlying rhythm is atrial flutter at 79 bpm.  EKG provided by her PCP from 01/25/2022 notes atrial fibrillation 106 bpm (see media section).   ? ?Patient denies any chest pain or heart failure symptoms.  She continues to swim 3 times a week at the [facility] and enjoys bowling. ? ?FUNCTIONAL STATUS: ?Swimming three days a week at [facility] and bowling.   ? ?ALLERGIES: ?Allergies  ?Allergen Reactions  ? Empagliflozin   ?  Other reaction(s): Yeast infections, itching  ? Pioglitazone   ?  Other reaction(s): dependent edema  ? Sulfa Antibiotics   ?  unknown  ? ? ?MEDICATION LIST  PRIOR TO VISIT: ?Current Meds  ?Medication Sig  ? apixaban (ELIQUIS) 5 MG TABS tablet Take 5 mg by mouth 2 (two) times daily.  ? Ascorbic Acid (VITAMIN C PO) Take by mouth.  ? atorvastatin (LIPITOR) 10 MG tablet Take 5 mg by mouth daily.  ? Calcium Carbonate-Vit D-Min (CALCIUM 1200 PO) Take by mouth.  ? diclofenac (VOLTAREN) 75 MG EC tablet Take 1 tablet (75 mg total) by mouth 2 (two) times daily.  ? Dulaglutide (TRULICITY [place]) Inject into the skin.  ? ferrous sulfate 325 (65 FE) MG tablet Take 325 mg by mouth daily with breakfast.  ? furosemide (LASIX) 20 MG tablet Take 0.5 tablets (10 mg total) by mouth every morning.  ? hydrochlorothiazide (HYDRODIURIL) 25 MG tablet Take 25 mg by mouth every morning.  ? levothyroxine (SYNTHROID, LEVOTHROID) 150 MCG tablet Take 150 mcg by mouth daily before breakfast.  ? metFORMIN (GLUCOPHAGE) 1000 MG tablet Take 1,000 mg by mouth 2 (two) times daily with a meal.  ? metoprolol succinate (TOPROL-XL) 50 MG 24 hr tablet Take 50 mg by mouth daily.  ? Multiple Vitamins-Minerals (CENTRUM SILVER PO) Take by mouth.  ? ramipril (ALTACE) 10 MG capsule Take 10 mg by mouth every morning.  ?  ? ?PAST MEDICAL HISTORY: ?Past Medical History:  ?Diagnosis Date  ? Arthritis   ? Diabetes mellitus without complication ([place])   ? Hemorrhoids   ? Hyperlipidemia   ? Hypertension   ? Hypothyroidism   ? Impingement syndrome of right shoulder   ? ? ?  PAST SURGICAL HISTORY: ?Past Surgical History:  ?Procedure Laterality Date  ? colonscopy     ? ingrown toenail    ? 1969   ? TONSILLECTOMY    ? TOTAL KNEE ARTHROPLASTY Left 12/09/2014  ? Procedure: LEFT TOTAL KNEE ARTHROPLASTY;  Surgeon: [name], MD;  Location: [facility];  Service: Orthopedics;  Laterality: Left;  ? trauma to left thumb    ? with surgical repair   ? TUBAL LIGATION    ? ? ?FAMILY HISTORY: ?The patient family history includes Arthritis/Rheumatoid in her sister; Atrial fibrillation in her sister; Congestive Heart Failure in her mother; Emphysema  in her mother; Heart attack in her brother; Heart attack (age of onset: 4) in her father; Hypertension in her brother; Other in her brother and sister. ? ?SOCIAL HISTORY:  ?The patient  reports that she has never smoked. She has never used smokeless tobacco. She reports that she does not drink alcohol and does not use drugs. ? ?REVIEW OF SYSTEMS: ?Review of Systems  ?Constitutional: Positive for malaise/fatigue.  ?Cardiovascular:  Negative for chest pain, dyspnea on exertion, leg swelling, palpitations and syncope.  ?Respiratory:  Negative for shortness of breath.   ? ?PHYSICAL EXAM: ? ?  02/01/2022  ? 10:56 AM 12/01/2017  ?  8:23 AM 06/03/2016  ?  7:59 AM  ?Vitals with BMI  ?Height '5\' 5"'$   '5\' 5"'$   ?Weight 235 lbs  236 lbs  ?BMI 39.11  39.4  ?Systolic 299 371   ?Diastolic 79 74   ?Pulse 94 92   ? ?CONSTITUTIONAL: Age-appropriate female, hemodynamically stable,  No acute distress.  ?SKIN: Skin is warm and dry. No rash noted. No cyanosis. No pallor. No jaundice ?HEAD: Normocephalic and atraumatic.  ?EYES: No scleral icterus ?MOUTH/THROAT: Moist oral membranes.  ?NECK: No JVD present. No thyromegaly noted. No carotid bruits  ?LYMPHATIC: No visible cervical adenopathy.  ?CHEST Normal respiratory effort. No intercostal retractions  ?LUNGS: Clear to auscultation bilaterally.  No stridor. No wheezes. No rales.  ?CARDIOVASCULAR: Irregularly irregular, variable S1-S2, no murmurs rubs or gallops appreciated secondary to ventricular rate. ?ABDOMINAL: Obese, soft, nontender, nondistended, positive bowel sounds in all 4 quadrants, no apparent ascites.  ?EXTREMITIES: 1+ bilateral peripheral edema, warm to touch, 2+ DP PT pulses. ?HEMATOLOGIC: No significant bruising ?NEUROLOGIC: Oriented to person, place, and time. Nonfocal. Normal muscle tone.  ?PSYCHIATRIC: Normal mood and affect. Normal behavior. Cooperative ? ?CARDIAC DATABASE: ?EKG: ?02/01/2022: Atrial fibrillation, 74 bpm, normal axis, without underlying injury  pattern. ? ?Echocardiogram: ?No results found for this or any previous visit from the past 1095 days. ? ?Stress Testing: ?No results found for this or any previous visit from the past 1095 days. ? ?Heart Catheterization: ?None ? ?LABORATORY DATA: ? ?  Latest Ref Rng & Units 12/11/2014  ?  5:18 AM 12/10/2014  ?  5:00 AM 12/02/2014  ?  9:05 AM  ?CBC  ?WBC 4.0 - 10.5 K/uL 10.7   10.5   9.9    ?Hemoglobin 12.0 - 15.0 g/dL 10.9   11.0   13.6    ?Hematocrit 36.0 - 46.0 % 35.4   36.2   43.9    ?Platelets 150 - 400 K/uL 175   172   182    ? ? ? ?  Latest Ref Rng & Units 12/11/2014  ?  5:18 AM 12/10/2014  ?  5:00 AM 12/02/2014  ?  9:05 AM  ?CMP  ?Glucose 70 - 99 mg/dL 167   157   167    ?  BUN 6 - 23 mg/dL '16   14   13    '$ ?Creatinine 0.50 - 1.10 mg/dL 0.73   0.92   0.80    ?Sodium 135 - 145 mmol/L 138   137   139    ?Potassium 3.5 - 5.1 mmol/L 3.7   3.7   3.9    ?Chloride 96 - 112 mmol/L 104   98   100    ?CO2 19 - 32 mmol/L '28   29   30    '$ ?Calcium 8.4 - 10.5 mg/dL 9.0   8.8   9.8    ?Total Protein 6.0 - 8.3 g/dL   6.9    ?Total Bilirubin 0.3 - 1.2 mg/dL   0.4    ?Alkaline Phos 39 - 117 U/L   80    ?AST 0 - 37 U/L   29    ?ALT 0 - 35 U/L   33    ? ? ?Lipid Panel  ?No results found for: CHOL, TRIG, HDL, CHOLHDL, VLDL, LDLCALC, LDLDIRECT, LABVLDL ? ?No components found for: NTPROBNP ?No results for input(s): PROBNP in the last 8760 hours. ?No results for input(s): TSH in the last 8760 hours. ? ?BMP ?No results for input(s): NA, K, CL, CO2, GLUCOSE, BUN, CREATININE, CALCIUM, GFRNONAA, GFRAA in the last 8760 hours. ? ?HEMOGLOBIN A1C ?No results found for: HGBA1C, MPG ? ?External Labs: ?Collected: 01/25/2022. ?Hemoglobin 14.4 g/dL, hematocrit 43.9%. ?BUN 14, creatinine 0.82 mg/dL. ?Sodium 141, potassium 4, chloride 102, bicarb 31. ?AST 21, ALT 22, alkaline phosphatase 69 ?Hemoglobin A1c 8.4. ?Total cholesterol 161, triglycerides 132, HDL 59, LDL 79, non-HDL 102. ?TSH 0.94 ? ? ?IMPRESSION: ? ?  ICD-10-CM   ?1. Persistent atrial fibrillation  (HCC)  I48.19 EKG 12-Lead  ?  furosemide (LASIX) 20 MG tablet  ?  Basic metabolic panel  ?  Hemoglobin and hematocrit, blood  ?  Magnesium  ?  ?2. Long term (current) use of anticoagulants  B35.32 Basic metabolic panel  ?

## 2022-02-08 ENCOUNTER — Other Ambulatory Visit: Payer: Self-pay

## 2022-02-08 DIAGNOSIS — I4819 Other persistent atrial fibrillation: Secondary | ICD-10-CM

## 2022-02-08 DIAGNOSIS — Z7901 Long term (current) use of anticoagulants: Secondary | ICD-10-CM

## 2022-02-09 DIAGNOSIS — I4819 Other persistent atrial fibrillation: Secondary | ICD-10-CM | POA: Diagnosis not present

## 2022-02-09 DIAGNOSIS — Z7901 Long term (current) use of anticoagulants: Secondary | ICD-10-CM | POA: Diagnosis not present

## 2022-02-10 LAB — BASIC METABOLIC PANEL
BUN/Creatinine Ratio: 18 (ref 12–28)
BUN: 14 mg/dL (ref 8–27)
CO2: 24 mmol/L (ref 20–29)
Calcium: 9.9 mg/dL (ref 8.7–10.3)
Chloride: 94 mmol/L — ABNORMAL LOW (ref 96–106)
Creatinine, Ser: 0.8 mg/dL (ref 0.57–1.00)
Glucose: 141 mg/dL — ABNORMAL HIGH (ref 70–99)
Potassium: 4.3 mmol/L (ref 3.5–5.2)
Sodium: 137 mmol/L (ref 134–144)
eGFR: 79 mL/min/{1.73_m2} (ref >59)

## 2022-02-10 LAB — HEMOGLOBIN AND HEMATOCRIT, BLOOD
Hematocrit: 41.2 % (ref 34.0–46.6)
Hemoglobin: 13.8 g/dL (ref 11.1–15.9)

## 2022-02-10 LAB — MAGNESIUM: Magnesium: 1.7 mg/dL (ref 1.6–2.3)

## 2022-02-16 DIAGNOSIS — H5712 Ocular pain, left eye: Secondary | ICD-10-CM | POA: Diagnosis not present

## 2022-02-16 DIAGNOSIS — H18832 Recurrent erosion of cornea, left eye: Secondary | ICD-10-CM | POA: Diagnosis not present

## 2022-02-22 DIAGNOSIS — Z7901 Long term (current) use of anticoagulants: Secondary | ICD-10-CM | POA: Diagnosis not present

## 2022-02-22 DIAGNOSIS — E1165 Type 2 diabetes mellitus with hyperglycemia: Secondary | ICD-10-CM | POA: Diagnosis not present

## 2022-02-22 DIAGNOSIS — I4891 Unspecified atrial fibrillation: Secondary | ICD-10-CM | POA: Diagnosis not present

## 2022-02-22 DIAGNOSIS — E039 Hypothyroidism, unspecified: Secondary | ICD-10-CM | POA: Diagnosis not present

## 2022-02-22 DIAGNOSIS — E7800 Pure hypercholesterolemia, unspecified: Secondary | ICD-10-CM | POA: Diagnosis not present

## 2022-02-22 DIAGNOSIS — I10 Essential (primary) hypertension: Secondary | ICD-10-CM | POA: Diagnosis not present

## 2022-02-23 ENCOUNTER — Encounter (HOSPITAL_COMMUNITY): Payer: Self-pay | Admitting: Cardiology

## 2022-02-24 ENCOUNTER — Other Ambulatory Visit: Payer: Self-pay

## 2022-02-24 ENCOUNTER — Ambulatory Visit (HOSPITAL_COMMUNITY)
Admission: RE | Admit: 2022-02-24 | Discharge: 2022-02-24 | Disposition: A | Discharge status: Home / Self Care | Payer: Medicare Other | Source: Ambulatory Visit | Attending: Cardiology | Admitting: Cardiology

## 2022-02-24 ENCOUNTER — Ambulatory Visit (HOSPITAL_COMMUNITY): Payer: Medicare Other | Admitting: General Practice

## 2022-02-24 ENCOUNTER — Ambulatory Visit (HOSPITAL_BASED_OUTPATIENT_CLINIC_OR_DEPARTMENT_OTHER): Payer: Medicare Other | Admitting: General Practice

## 2022-02-24 ENCOUNTER — Ambulatory Visit (HOSPITAL_COMMUNITY): Payer: Medicare Other

## 2022-02-24 ENCOUNTER — Encounter (HOSPITAL_COMMUNITY)
Admission: RE | Disposition: A | Discharge status: Home / Self Care | Payer: Self-pay | Source: Ambulatory Visit | Attending: Cardiology

## 2022-02-24 ENCOUNTER — Encounter (HOSPITAL_COMMUNITY): Payer: Self-pay | Admitting: Cardiology

## 2022-02-24 DIAGNOSIS — I081 Rheumatic disorders of both mitral and tricuspid valves: Secondary | ICD-10-CM | POA: Diagnosis not present

## 2022-02-24 DIAGNOSIS — Z7984 Long term (current) use of oral hypoglycemic drugs: Secondary | ICD-10-CM | POA: Insufficient documentation

## 2022-02-24 DIAGNOSIS — M7989 Other specified soft tissue disorders: Secondary | ICD-10-CM | POA: Insufficient documentation

## 2022-02-24 DIAGNOSIS — Z7901 Long term (current) use of anticoagulants: Secondary | ICD-10-CM | POA: Diagnosis not present

## 2022-02-24 DIAGNOSIS — I4819 Other persistent atrial fibrillation: Secondary | ICD-10-CM | POA: Diagnosis not present

## 2022-02-24 DIAGNOSIS — E785 Hyperlipidemia, unspecified: Secondary | ICD-10-CM | POA: Insufficient documentation

## 2022-02-24 DIAGNOSIS — I4891 Unspecified atrial fibrillation: Secondary | ICD-10-CM

## 2022-02-24 DIAGNOSIS — I4892 Unspecified atrial flutter: Secondary | ICD-10-CM | POA: Insufficient documentation

## 2022-02-24 DIAGNOSIS — Z7985 Long-term (current) use of injectable non-insulin antidiabetic drugs: Secondary | ICD-10-CM | POA: Insufficient documentation

## 2022-02-24 DIAGNOSIS — I10 Essential (primary) hypertension: Secondary | ICD-10-CM | POA: Insufficient documentation

## 2022-02-24 DIAGNOSIS — E119 Type 2 diabetes mellitus without complications: Secondary | ICD-10-CM | POA: Insufficient documentation

## 2022-02-24 DIAGNOSIS — Z79899 Other long term (current) drug therapy: Secondary | ICD-10-CM | POA: Diagnosis not present

## 2022-02-24 HISTORY — PX: CARDIOVERSION: SHX1299

## 2022-02-24 HISTORY — PX: BUBBLE STUDY: SHX6837

## 2022-02-24 HISTORY — PX: TEE WITHOUT CARDIOVERSION: SHX5443

## 2022-02-24 LAB — ECHO TEE
AR max vel: 2.61 cm2
AV Area VTI: 2.7 cm2
AV Area mean vel: 2.75 cm2
AV Mean grad: 3 mmHg
AV Peak grad: 7.3 mmHg
Ao pk vel: 1.35 m/s
Area-P 1/2: 4.31 cm2
MV VTI: 3.52 cm2

## 2022-02-24 LAB — GLUCOSE, CAPILLARY: Glucose-Capillary: 142 mg/dL — ABNORMAL HIGH (ref 70–99)

## 2022-02-24 SURGERY — ECHOCARDIOGRAM, TRANSESOPHAGEAL
Anesthesia: General

## 2022-02-24 MED ORDER — METOPROLOL SUCCINATE ER 50 MG PO TB24: 100.0000 mg | ORAL_TABLET | Freq: Every morning | ORAL | 0 refills | Status: DC

## 2022-02-24 MED ORDER — LIDOCAINE 2% (20 MG/ML) 5 ML SYRINGE
INTRAMUSCULAR | Status: DC | PRN
Start: 2022-02-24 — End: 2022-02-24
  Administered 2022-02-24: 100 mg via INTRAVENOUS

## 2022-02-24 MED ORDER — SODIUM CHLORIDE 0.9 % IV SOLN: INTRAVENOUS | Status: DC

## 2022-02-24 MED ORDER — PROPOFOL 500 MG/50ML IV EMUL
INTRAVENOUS | Status: DC | PRN
  Administered 2022-02-24: 150 ug/kg/min via INTRAVENOUS

## 2022-02-24 MED ORDER — PHENYLEPHRINE 80 MCG/ML (10ML) SYRINGE FOR IV PUSH (FOR BLOOD PRESSURE SUPPORT)
PREFILLED_SYRINGE | INTRAVENOUS | Status: DC | PRN
  Administered 2022-02-24: 80 ug via INTRAVENOUS

## 2022-02-24 MED ORDER — PROPOFOL 10 MG/ML IV BOLUS
INTRAVENOUS | Status: DC | PRN
  Administered 2022-02-24: 30 mg via INTRAVENOUS
  Administered 2022-02-24 (×2): 20 mg via INTRAVENOUS

## 2022-02-24 MED ORDER — FUROSEMIDE 20 MG PO TABS: 10.0000 mg | ORAL_TABLET | ORAL | 0 refills | Status: DC | PRN

## 2022-02-24 NOTE — Discharge Instructions (Signed)
TEE  YOU HAD AN CARDIAC PROCEDURE TODAY: Refer to the procedure report and other information in the discharge instructions given to you for any specific questions about what was found during the examination. If this information does not answer your questions, please call [facility] office at [phone] to clarify.   DIET: Your first meal following the procedure should be a light meal and then it is ok to progress to your normal diet. A half-sandwich or bowl of soup is an example of a good first meal. Heavy or fried foods are harder to digest and may make you feel nauseous or bloated. Drink plenty of fluids but you should avoid alcoholic beverages for 24 hours. If you had a esophageal dilation, please see attached instructions for diet.   ACTIVITY: Your care partner should take you home directly after the procedure. You should plan to take it easy, moving slowly for the rest of the day. You can resume normal activity the day after the procedure however YOU SHOULD NOT DRIVE, use power tools, machinery or perform tasks that involve climbing or major physical exertion for 24 hours (because of the sedation medicines used during the test).   SYMPTOMS TO REPORT IMMEDIATELY: A cardiologist can be reached at any hour. Please call [phone] for any of the following symptoms:  Vomiting of blood or coffee ground material  New, significant abdominal pain  New, significant chest pain or pain under the shoulder blades  Painful or persistently difficult swallowing  New shortness of breath  Black, tarry-looking or red, bloody stools  FOLLOW UP:  Please also call with any specific questions about appointments or follow up tests.  Electrical Cardioversion  Electrical cardioversion is the delivery of a jolt of electricity to restore a normal rhythm to the heart. A rhythm that is too fast or is not regular keeps the heart from pumping well. In this procedure, sticky patches or metal paddles are placed on  the chest to deliver electricity to the heart from a device.  If this information does not answer your questions, please call [facility] office at [phone] to clarify.   Follow these instructions at home: You may have some redness on the skin where the shocks were given.  You may apply over-the-counter hydrocortisone cream or aloe vera to alleviate skin irritation. YOU SHOULD NOT DRIVE, use power tools, machinery or perform tasks that involve climbing or major physical exertion for 24 hours (because of the sedation medicines used during the test).  Take over-the-counter and prescription medicines only as told by your health care provider. Ask your health care provider how to check your pulse. Check it often. Rest for 48 hours after the procedure or as told by your health care provider. Avoid or limit your caffeine use as told by your health care provider. Keep all follow-up visits as told by your health care provider. This is important.  FOLLOW UP:  Please also call with any specific questions about appointments or follow up tests.   

## 2022-02-24 NOTE — Anesthesia Preprocedure Evaluation (Signed)
Anesthesia Evaluation  ?Patient identified by MRN, date of birth, ID band ?Patient awake ? ? ? ?Reviewed: ?Allergy & Precautions, NPO status , Patient's Chart, lab work & pertinent test results, reviewed documented beta blocker date and time  ? ?Airway ?Mallampati: II ? ?TM Distance: >3 FB ?Neck ROM: Full ? ? ? Dental ? ?(+) Teeth Intact, Dental Advisory Given ?  ?Pulmonary ?neg pulmonary ROS,  ?  ?Pulmonary exam normal ?breath sounds clear to auscultation ? ? ? ? ? ? Cardiovascular ?hypertension, Pt. on home beta blockers and Pt. on medications ? ?Rhythm:Irregular Rate:Abnormal ? ? ?  ?Neuro/Psych ?negative neurological ROS ? negative psych ROS  ? GI/Hepatic ?negative GI ROS, Neg liver ROS,   ?Endo/Other  ?diabetes, Type 2, Oral Hypoglycemic AgentsHypothyroidism Obesity ? ? Renal/GU ?negative Renal ROS  ? ?  ?Musculoskeletal ? ?(+) Arthritis ,  ? Abdominal ?  ?Peds ? Hematology ? ?(+) Blood dyscrasia (Eliquis), ,   ?Anesthesia Other Findings ?Day of surgery medications reviewed with the patient. ? Reproductive/Obstetrics ? ?  ? ? ? ? ? ? ? ? ? ? ? ? ? ?  ?  ? ? ? ? ? ? ? ? ?Anesthesia Physical ?Anesthesia Plan ? ?ASA: 3 ? ?Anesthesia Plan: General  ? ?Post-op Pain Management:   ? ?Induction: Intravenous ? ?PONV Risk Score and Plan: 3 and TIVA and Treatment may vary due to age or medical condition ? ?Airway Management Planned: Natural Airway and Nasal Cannula ? ?Additional Equipment:  ? ?Intra-op Plan:  ? ?Post-operative Plan:  ? ?Informed Consent: I have reviewed the patients History and Physical, chart, labs and discussed the procedure including the risks, benefits and alternatives for the proposed anesthesia with the patient or authorized representative who has indicated his/her understanding and acceptance.  ? ? ? ?Dental advisory given ? ?Plan Discussed with: CRNA ? ?Anesthesia Plan Comments:   ? ? ? ? ? ? ?Anesthesia Quick Evaluation ? ?

## 2022-02-24 NOTE — Anesthesia Procedure Notes (Signed)
Procedure Name: [place] ?Date/Time: 02/24/2022 1:08 PM ?Performed by: Dorann Lodge, CRNA ?Pre-anesthesia Checklist: Patient identified, Emergency Drugs available, Patient being monitored and Suction available ?Patient Re-evaluated:Patient Re-evaluated prior to induction ?Oxygen Delivery Method: Nasal cannula ?Induction Type: IV induction ?Airway Equipment and Method: Bite block ? ? ? ? ?

## 2022-02-24 NOTE — Interval H&P Note (Signed)
History and Physical Interval Note: ? ?02/24/2022 ?12:45 PM ? ?Anne Lang  has presented today for surgery, with the diagnosis of AFLUTTER.  The various methods of treatment have been discussed with the patient and family. After consideration of risks, benefits and other options for treatment, the patient has consented to  Procedure(s): ?TRANSESOPHAGEAL ECHOCARDIOGRAM (TEE) (N/A) ?CARDIOVERSION (N/A) as a surgical intervention.  The patient's history has been reviewed, patient examined, no change in status, stable for surgery.  I have reviewed the patient's chart and labs.  Questions were answered to the patient's satisfaction.   ? ? ?Emika Tiano [place], DO, FACC ? ?Pager: [phone] ?Office: [phone] ? ? ? ?

## 2022-02-24 NOTE — CV Procedure (Signed)
Transesophageal echocardiogram (TEE) : Preliminary report ?02/24/22 ? ?Sedation: See anesthesia records.  ? ?TEE was performed without complications ?  ?LV: Normal EF. ?RV: Normal structure and function.  ?LA: Grossly dilated.  Spontaneous echo contrast was present.  No thrombus present. ?Left atrial appendage: Spontaneous echo contrast was not present.  No thrombus present. ?Inter atrial septum is intact without defect. Double contrast study negative for atrial level shunting. ?RA: Grossly normal.. ?  ?MV: mild regurgitation,nostenosis, no vegetation noted.  ?TV: moderate regurgitation, no stenosis, no vegetation noted. ?AV: no regurgitation,no stenosis, no vegetation noted.   ?PV: trace regurgitation, no stenosis, no vegetation noted. ?  ?Thoracic and ascending aorta: No significant plaque. ? ?Final report forthcoming. ? ?Rex Kras, DO, FACC ? ?Pager: [phone] ?Office: [phone] ? ? ?Direct current cardioversion: ?Procedure: Electrical Cardioversion ?Indications:  Atrial Fibrillation ? ?Procedure Details: ? ?Consent: Risks of procedure as well as the alternatives and risks of each were explained to the (patient/caregiver).  Consent for procedure obtained. ? ?Time Out: Verified patient identification, verified procedure, site/side was marked, verified correct patient position, special equipment/implants available, medications/allergies/relevent history reviewed, required imaging and test results available. PERFORMED. ? ?Patient placed on cardiac monitor, pulse oximetry, supplemental oxygen as necessary.  ?Sedation given:  see anesthesia records.  ?Pacer pads placed anterior and posterior chest. ? ?Cardioverted 1 time(s).  ?Cardioversion with synchronized biphasic 200J shock. ? ?Evaluation: ?Findings: Post procedure EKG shows: NSR ?Complications: None ?Patient did tolerate procedure well. ? ?Family has been updated.  ? ?Rex Kras, DO, FACC ? ?Pager: [phone] ?Office: [phone] ?02/24/2022, 1:39  PM ? ?

## 2022-02-24 NOTE — Transfer of Care (Signed)
Immediate Anesthesia Transfer of Care Note ? ?Patient: Anne Lang ? ?Procedure(s) Performed: TRANSESOPHAGEAL ECHOCARDIOGRAM (TEE) ?CARDIOVERSION ?BUBBLE STUDY ? ?Patient Location: Endoscopy Unit ? ?Anesthesia Type:MAC ? ?Level of Consciousness: awake and alert  ? ?Airway & Oxygen Therapy: Patient Spontanous Breathing ? ?Post-op Assessment: Report given to RN and Post -op Vital signs reviewed and stable ? ?Post vital signs: Reviewed and stable ? ?Last Vitals:  ?Vitals Value Taken Time  ?BP 115/62 02/24/22 1342  ?Temp    ?Pulse 89 02/24/22 1342  ?Resp 19 02/24/22 1342  ?SpO2 95 % 02/24/22 1342  ?Vitals shown include unvalidated device data. ? ?Last Pain:  ?Vitals:  ? 02/24/22 1142  ?TempSrc: Temporal  ?PainSc: 0-No pain  ?   ? ?  ? ?Complications: No notable events documented. ?

## 2022-02-24 NOTE — Anesthesia Postprocedure Evaluation (Signed)
Anesthesia Post Note ? ?Patient: Anne Lang ? ?Procedure(s) Performed: TRANSESOPHAGEAL ECHOCARDIOGRAM (TEE) ?CARDIOVERSION ?BUBBLE STUDY ? ?  ? ?Patient location during evaluation: Endoscopy ?Anesthesia Type: General ?Level of consciousness: awake and alert ?Pain management: pain level controlled ?Vital Signs Assessment: post-procedure vital signs reviewed and stable ?Respiratory status: spontaneous breathing, nonlabored ventilation, respiratory function stable and patient connected to nasal cannula oxygen ?Cardiovascular status: blood pressure returned to baseline and stable ?Postop Assessment: no apparent nausea or vomiting ?Anesthetic complications: no ? ? ?No notable events documented. ? ?Last Vitals:  ?Vitals:  ? 02/24/22 1350 02/24/22 1400  ?BP: (!) 106/58 (!) 103/54  ?Pulse: 82 82  ?Resp: 19 16  ?Temp:    ?SpO2: 95% 94%  ?  ?Last Pain:  ?Vitals:  ? 02/24/22 1400  ?TempSrc:   ?PainSc: 0-No pain  ? ? ?  ?  ?  ?  ?  ?  ? ?[place] Lighter ? ? ? ? ?

## 2022-03-10 ENCOUNTER — Ambulatory Visit: Payer: Medicare Other | Admitting: Cardiology

## 2022-03-10 ENCOUNTER — Encounter: Payer: Self-pay | Admitting: Cardiology

## 2022-03-10 VITALS — BP 125/75 | HR 87 | Temp 98.0°F | Resp 16 | Ht 65.0 in | Wt 231.0 lb

## 2022-03-10 DIAGNOSIS — I4819 Other persistent atrial fibrillation: Secondary | ICD-10-CM

## 2022-03-10 DIAGNOSIS — Z7901 Long term (current) use of anticoagulants: Secondary | ICD-10-CM

## 2022-03-10 DIAGNOSIS — E119 Type 2 diabetes mellitus without complications: Secondary | ICD-10-CM | POA: Diagnosis not present

## 2022-03-10 DIAGNOSIS — I10 Essential (primary) hypertension: Secondary | ICD-10-CM | POA: Diagnosis not present

## 2022-03-10 MED ORDER — APIXABAN 5 MG PO TABS: 5.0000 mg | ORAL_TABLET | Freq: Two times a day (BID) | ORAL | 0 refills | Status: DC

## 2022-03-10 MED ORDER — METOPROLOL SUCCINATE ER 100 MG PO TB24: 100.0000 mg | ORAL_TABLET | Freq: Every day | ORAL | 0 refills | Status: DC

## 2022-03-10 NOTE — Progress Notes (Signed)
? ?Date:  03/10/2022  ? ?ID:  Marina Boerner, DOB 17-Sep-1951, MRN [id number] ? ?PCP:  Aretta Nip, MD  ?Cardiologist:  Rex Kras, DO, [facility] (established care 02/01/2022) ? ?Date: 03/10/22 ?Last Office Visit: 02/01/2022 ? ?Chief Complaint  ?Patient presents with  ? Atrial Fibrillation  ? Follow-up  ?  2 week - s/p cardioversion.   ? ? ?HPI  ?Anne Lang is a 71 y.o. Caucasian female whose past medical history and cardiovascular risk factors include: Hyperlipidemia, hypertension, diabetes melitis type II, persistent atrial fibrillation s/p DDCV (02/24/2022). ? ?She is referred to the office at the request of Rankins, Bill Salinas, MD for evaluation of atrial flutter/fibrillation.. ? ?In December 2022 after her cataract surgery she was noted to be in atrial flutter.  She did not seek medical attention and when she went to her yearly physical in March 2023 as was in Afib.  Thereafter was referred to cardiology for further evaluation and management.  After being on oral anticoagulation for at least 4 weeks patient underwent TEE guided cardioversion and converted to normal sinus rhythm after 200 J synchronized x1 she now presents for 2-week follow-up. ? ?Patient states that she felt well for about 1 week after the procedure but over the last week she feels tired, fatigued and states that "I may be back in A-fib" and surface EKG confirms her rhythm to be Afib.  She denies anginal discomfort or heart failure symptoms.  She does not endorse any evidence of bleeding.  Would like her Eliquis and metoprolol refilled.  She has an appointment with sleep medicine on Mar 18, 2022 for evaluation. ? ?FUNCTIONAL STATUS: ?Swimming three days a week at [facility] and bowling.   ? ?ALLERGIES: ?Allergies  ?Allergen Reactions  ? Empagliflozin Other (See Comments)  ?  Other reaction(s): Yeast infections, itching  ? Pioglitazone Other (See Comments)  ?  dependent edema  ? Sulfa Antibiotics Other (See Comments)  ?  Urinary tract  infection (1977)  ? ? ?MEDICATION LIST PRIOR TO VISIT: ?Current Meds  ?Medication Sig  ? acetaminophen (TYLENOL) 500 MG tablet Take 1,000 mg by mouth 2 (two) times daily.  ? Ascorbic Acid (VITAMIN C PO) Take 1,000 mg by mouth daily.  ? atorvastatin (LIPITOR) 10 MG tablet Take 5 mg by mouth daily.  ? Calcium Carbonate-Vit D-Min (CALCIUM 1200 PO) Take 600 mg by mouth daily.  ? docusate sodium (COLACE) 100 MG capsule Take 100 mg by mouth daily.  ? Dulaglutide (TRULICITY [place]) Inject 3 mg into the skin every Monday.  ? ferrous sulfate 325 (65 FE) MG tablet Take 325 mg by mouth 3 (three) times a week.  ? furosemide (LASIX) 20 MG tablet Take 0.5 tablets (10 mg total) by mouth as needed for edema (If you gain 1 pound over 24hrs or 3 pounds over 7 days take lasix util back to dry weight.).  ? hydrochlorothiazide (HYDRODIURIL) 25 MG tablet Take 25 mg by mouth every morning.  ? levothyroxine (SYNTHROID, LEVOTHROID) 150 MCG tablet Take 150 mcg by mouth daily before breakfast.  ? metFORMIN (GLUCOPHAGE) 1000 MG tablet Take 1,000 mg by mouth 2 (two) times daily with a meal.  ? metoprolol succinate (TOPROL-XL) 100 MG 24 hr tablet Take 1 tablet (100 mg total) by mouth daily. Take with or immediately following a meal.  ? Multiple Vitamins-Minerals (CENTRUM SILVER PO) Take 1 tablet by mouth daily.  ? ramipril (ALTACE) 10 MG capsule Take 10 mg by mouth every morning.  ? Vitamin D3 (  VITAMIN D) 25 MCG tablet Take 1,000 Units by mouth daily.  ? [DISCONTINUED] apixaban (ELIQUIS) 5 MG TABS tablet Take 5 mg by mouth 2 (two) times daily.  ? [DISCONTINUED] metoprolol succinate (TOPROL-XL) 50 MG 24 hr tablet Take 2 tablets (100 mg total) by mouth in the morning.  ?  ? ?PAST MEDICAL HISTORY: ?Past Medical History:  ?Diagnosis Date  ? Arthritis   ? Atrial fibrillation ([place])   ? Diabetes mellitus without complication ([place])   ? Hemorrhoids   ? Hyperlipidemia   ? Hypertension   ? Hypothyroidism   ? Impingement syndrome of right shoulder   ? ? ?PAST  SURGICAL HISTORY: ?Past Surgical History:  ?Procedure Laterality Date  ? BUBBLE STUDY  02/24/2022  ? Procedure: BUBBLE STUDY;  Surgeon: Rex Kras, DO;  Location: [place];  Service: Cardiovascular;;  ? CARDIOVERSION N/A 02/24/2022  ? Procedure: CARDIOVERSION;  Surgeon: Rex Kras, DO;  Location: [facility];  Service: Cardiovascular;  Laterality: N/A;  shocked @ 1323 '@200j'$  x1  ? colonscopy     ? ingrown toenail    ? 1969   ? TEE WITHOUT CARDIOVERSION N/A 02/24/2022  ? Procedure: TRANSESOPHAGEAL ECHOCARDIOGRAM (TEE);  Surgeon: Rex Kras, DO;  Location: [facility];  Service: Cardiovascular;  Laterality: N/A;  ? TONSILLECTOMY    ? TOTAL KNEE ARTHROPLASTY Left 12/09/2014  ? Procedure: LEFT TOTAL KNEE ARTHROPLASTY;  Surgeon: [name], MD;  Location: [facility];  Service: Orthopedics;  Laterality: Left;  ? trauma to left thumb    ? with surgical repair   ? TUBAL LIGATION    ? ? ?FAMILY HISTORY: ?The patient family history includes Arthritis/Rheumatoid in her sister; Atrial fibrillation in her sister; Congestive Heart Failure in her mother; Emphysema in her mother; Heart attack in her brother; Heart attack (age of onset: 75) in her father; Hypertension in her brother; Other in her brother and sister. ? ?SOCIAL HISTORY:  ?The patient  reports that she has never smoked. She has never used smokeless tobacco. She reports that she does not drink alcohol and does not use drugs. ? ?REVIEW OF SYSTEMS: ?Review of Systems  ?Constitutional: Positive for malaise/fatigue.  ?Cardiovascular:  Negative for chest pain, dyspnea on exertion, leg swelling, palpitations and syncope.  ?Respiratory:  Negative for shortness of breath.   ? ?PHYSICAL EXAM: ? ?  03/10/2022  ? 10:37 AM 02/24/2022  ?  2:00 PM 02/24/2022  ?  1:50 PM  ?Vitals with BMI  ?Height '5\' 5"'$     ?Weight 231 lbs    ?BMI 38.44    ?Systolic 376 283 151  ?Diastolic 75 54 58  ?Pulse 87 82 82  ? ?CONSTITUTIONAL: Age-appropriate female, hemodynamically stable,  No acute  distress.  ?SKIN: Skin is warm and dry. No rash noted. No cyanosis. No pallor. No jaundice ?HEAD: Normocephalic and atraumatic.  ?EYES: No scleral icterus ?MOUTH/THROAT: Moist oral membranes.  ?NECK: No JVD present. No thyromegaly noted. No carotid bruits  ?LYMPHATIC: No visible cervical adenopathy.  ?CHEST Normal respiratory effort. No intercostal retractions  ?LUNGS: Clear to auscultation bilaterally.  No stridor. No wheezes. No rales.  ?CARDIOVASCULAR: Irregularly irregular, variable S1-S2, no murmurs rubs or gallops appreciated secondary to ventricular rate. ?ABDOMINAL: Obese, soft, nontender, nondistended, positive bowel sounds in all 4 quadrants, no apparent ascites.  ?EXTREMITIES: Trace bilateral peripheral edema, warm to touch, 2+ DP PT pulses. ?HEMATOLOGIC: No significant bruising ?NEUROLOGIC: Oriented to person, place, and time. Nonfocal. Normal muscle tone.  ?PSYCHIATRIC: Normal mood and affect. Normal behavior. Cooperative ?  No significant change in physical examination over the last 2 weeks. ? ?CARDIAC DATABASE: ?DDCV 02/24/2022: 200J x 1 converted to NSR.  ? ?EKG: ?02/01/2022: Atrial fibrillation, 74 bpm, normal axis, without underlying injury pattern. ?02/24/2022: NSR, 84 bpm, without underlying ischemia or injury pattern. ?03/10/2022: Atrial fibrillation, 91 bpm. ? ?Echocardiogram: ?No results found for this or any previous visit from the past 1095 days. ? ?Stress Testing: ?No results found for this or any previous visit from the past 1095 days. ? ?Heart Catheterization: ?None ? ?LABORATORY DATA: ? ?  Latest Ref Rng & Units 02/09/2022  ?  9:49 AM 12/11/2014  ?  5:18 AM 12/10/2014  ?  5:00 AM  ?CBC  ?WBC 4.0 - 10.5 K/uL  10.7   10.5    ?Hemoglobin 11.1 - 15.9 g/dL 13.8   10.9   11.0    ?Hematocrit 34.0 - 46.6 % 41.2   35.4   36.2    ?Platelets 150 - 400 K/uL  175   172    ? ? ? ?  Latest Ref Rng & Units 02/09/2022  ?  9:48 AM 12/11/2014  ?  5:18 AM 12/10/2014  ?  5:00 AM  ?CMP  ?Glucose 70 - 99 mg/dL 141   167   157     ?BUN 8 - 27 mg/dL '14   16   14    '$ ?Creatinine 0.57 - 1.00 mg/dL 0.80   0.73   0.92    ?Sodium 134 - 144 mmol/L 137   138   137    ?Potassium 3.5 - 5.2 mmol/L 4.3   3.7   3.7    ?Chloride 96 - 106 mmol/L 94   1

## 2022-03-16 ENCOUNTER — Ambulatory Visit: Payer: Medicare Other

## 2022-03-16 DIAGNOSIS — I4819 Other persistent atrial fibrillation: Secondary | ICD-10-CM | POA: Diagnosis not present

## 2022-03-17 DIAGNOSIS — E1149 Type 2 diabetes mellitus with other diabetic neurological complication: Secondary | ICD-10-CM | POA: Diagnosis not present

## 2022-03-17 DIAGNOSIS — I10 Essential (primary) hypertension: Secondary | ICD-10-CM | POA: Diagnosis not present

## 2022-03-17 DIAGNOSIS — E039 Hypothyroidism, unspecified: Secondary | ICD-10-CM | POA: Diagnosis not present

## 2022-03-17 DIAGNOSIS — E7800 Pure hypercholesterolemia, unspecified: Secondary | ICD-10-CM | POA: Diagnosis not present

## 2022-03-17 DIAGNOSIS — Z7901 Long term (current) use of anticoagulants: Secondary | ICD-10-CM | POA: Diagnosis not present

## 2022-03-17 DIAGNOSIS — I4891 Unspecified atrial fibrillation: Secondary | ICD-10-CM | POA: Diagnosis not present

## 2022-03-17 DIAGNOSIS — F331 Major depressive disorder, recurrent, moderate: Secondary | ICD-10-CM | POA: Diagnosis not present

## 2022-03-18 DIAGNOSIS — E039 Hypothyroidism, unspecified: Secondary | ICD-10-CM | POA: Diagnosis not present

## 2022-03-18 DIAGNOSIS — E669 Obesity, unspecified: Secondary | ICD-10-CM | POA: Diagnosis not present

## 2022-03-18 DIAGNOSIS — I10 Essential (primary) hypertension: Secondary | ICD-10-CM | POA: Diagnosis not present

## 2022-03-18 DIAGNOSIS — G4719 Other hypersomnia: Secondary | ICD-10-CM | POA: Diagnosis not present

## 2022-03-18 DIAGNOSIS — E1169 Type 2 diabetes mellitus with other specified complication: Secondary | ICD-10-CM | POA: Diagnosis not present

## 2022-03-22 ENCOUNTER — Ambulatory Visit: Payer: Medicare Other

## 2022-03-22 DIAGNOSIS — I4819 Other persistent atrial fibrillation: Secondary | ICD-10-CM

## 2022-03-24 ENCOUNTER — Other Ambulatory Visit: Payer: Self-pay | Admitting: Cardiology

## 2022-03-24 DIAGNOSIS — I4819 Other persistent atrial fibrillation: Secondary | ICD-10-CM

## 2022-03-24 MED ORDER — FLECAINIDE ACETATE 50 MG PO TABS: 50.0000 mg | ORAL_TABLET | Freq: Two times a day (BID) | ORAL | 0 refills | Status: DC

## 2022-03-24 MED ORDER — DILTIAZEM HCL ER COATED BEADS 180 MG PO CP24: 180.0000 mg | ORAL_CAPSULE | Freq: Every day | ORAL | 0 refills | Status: DC

## 2022-03-24 NOTE — Progress Notes (Signed)
CARDIOLOGY TELEPHONE ENCOUNTER ?03/24/22 ? ?Patient's name: Anne Lang.   ?MRN: [id number].    ?DOB: January 24, 1951 ?Primary care provider: Aretta Nip, MD. ?Primary cardiologist: Rex Kras, DO, [facility] ? ?Interaction regarding this patient's care today: ? ?Patient's primary care called on Mar 18, 2022 with concerns of the patient having depression and considering initiation of antidepressants. ? ?Given the fact that she is on metoprolol for rate control strategy I questioned if this was affecting her overall wellbeing. ? ?According to the patient's family her symptoms of depression have been ongoing for the last several years after the loss of her sister who she was very close to. ? ?With regards to antiarrhythmic medications she was awaiting echo and stress test which were recently done. ? ?The shared decision was to discontinue metoprolol for reasons mentioned above. ? ?We will start diltiazem 180 mg p.o. daily. ? ?We will start flecainide 50 mg p.o. twice daily for now.  This medication can be uptitrated to 100 mg p.o. twice daily which we can consider at the next office visit. ? ?Medication profile discussed with the patient over the phone along with the results of the echo and stress test. ? ?Impression: ?  ICD-10-CM   ?1. Persistent atrial fibrillation (HCC)  I48.19 flecainide (TAMBOCOR) 50 MG tablet  ?  diltiazem (CARDIZEM CD) 180 MG 24 hr capsule  ?  ? ? ?Meds ordered this encounter  ?Medications  ? flecainide (TAMBOCOR) 50 MG tablet  ?  Sig: Take 1 tablet (50 mg total) by mouth 2 (two) times daily.  ?  Dispense:  180 tablet  ?  Refill:  0  ? diltiazem (CARDIZEM CD) 180 MG 24 hr capsule  ?  Sig: Take 1 capsule (180 mg total) by mouth daily.  ?  Dispense:  90 capsule  ?  Refill:  0  ? ? ?No orders of the defined types were placed in this encounter. ? ? ?Recommendations: ?Persistent atrial fibrillation ([place]) ?Discontinue metoprolol. ?Start diltiazem 180 mg p.o. daily. ?Start flecainide 50 mg p.o.  twice daily ?Follow-up in the office in 2 weeks with EKG on arrival. ?Call if questions or concerns arise. ? ?Patient states that she has not been started on antidepressants.  We will reach out to her PCP with the medication changes as discussed above. ? ?We will forward a copy to her PCP for continuity of care. ? ?Telephone encounter total time: 15 minutes. ? ?Rex Kras, DO, FACC ? ?Pager: [phone] ?Office: [phone] ? ?

## 2022-04-08 ENCOUNTER — Encounter: Payer: Self-pay | Admitting: Cardiology

## 2022-04-08 ENCOUNTER — Ambulatory Visit: Payer: Medicare Other | Admitting: Cardiology

## 2022-04-08 VITALS — BP 135/58 | HR 84 | Temp 97.7°F | Resp 16 | Ht 65.0 in | Wt 229.0 lb

## 2022-04-08 DIAGNOSIS — Z7901 Long term (current) use of anticoagulants: Secondary | ICD-10-CM

## 2022-04-08 DIAGNOSIS — I10 Essential (primary) hypertension: Secondary | ICD-10-CM

## 2022-04-08 DIAGNOSIS — I4819 Other persistent atrial fibrillation: Secondary | ICD-10-CM

## 2022-04-08 DIAGNOSIS — Z79899 Other long term (current) drug therapy: Secondary | ICD-10-CM | POA: Diagnosis not present

## 2022-04-08 DIAGNOSIS — E119 Type 2 diabetes mellitus without complications: Secondary | ICD-10-CM | POA: Diagnosis not present

## 2022-04-08 MED ORDER — FLECAINIDE ACETATE 100 MG PO TABS: 100.0000 mg | ORAL_TABLET | Freq: Two times a day (BID) | ORAL | 0 refills | Status: DC

## 2022-04-08 NOTE — Progress Notes (Signed)
Date:  04/08/2022   ID:  [name], DOB 07/21/1951, MRN [id number]  PCP:  Faustino Congress, NP  Cardiologist:  Rex Kras, DO, [facility] (established care 02/01/2022)  Date: 04/08/22 Last Office Visit: 03/10/2022  Chief Complaint  Patient presents with   Atrial Fibrillation   Results   Follow-up    HPI  Anne Lang is a 71 y.o. Caucasian female whose past medical history and cardiovascular risk factors include: Hyperlipidemia, hypertension, diabetes melitis type II, depression, persistent atrial fibrillation s/p DDCV (02/24/2022).  She is referred to the office at the request of Rankins, Bill Salinas, MD for evaluation of atrial flutter/fibrillation..  Had cataract surgery in December 2022 and was noted to be in atrial flutter; however, did not seek medical attention until her yearly well visit which was in March 2023.  Thereafter referred to cardiology for atrial flutter management.  She underwent TEE guided cardioversion and converted to normal sinus rhythm after 200 J x 1 and that 2-week follow-up visit she was back in A-fib.  Since last office visit her PCP called with regards to underlying depression.  She is started on antidepressant and her metoprolol was changed to diltiazem.  She did undergo stress test which was reported to be overall low risk and then she was started on flecainide for rhythm management on 03/30/2022.  She now presents for follow-up.    Clinically, feels better and has more energy.  She denies angina pectoris or heart failure symptoms.  Home blood pressure log reviewed.  Ventricular rate is greater than 80 bpm consistently.  Home blood pressures are well controlled.  Started on Wellbutrin on 04/03/2022 by her PCP for depression.  FUNCTIONAL STATUS: Swimming three days a week at [facility] and bowling.    ALLERGIES: Allergies  Allergen Reactions   Empagliflozin Other (See Comments)    Other reaction(s): Yeast infections, itching   Pioglitazone  Other (See Comments)    dependent edema   Sulfa Antibiotics Other (See Comments)    Urinary tract infection (1977)    MEDICATION LIST PRIOR TO VISIT: Current Meds  Medication Sig   acetaminophen (TYLENOL) 500 MG tablet Take 1,000 mg by mouth 2 (two) times daily.   apixaban (ELIQUIS) 5 MG TABS tablet Take 1 tablet (5 mg total) by mouth 2 (two) times daily.   Ascorbic Acid (VITAMIN C PO) Take 1,000 mg by mouth daily.   atorvastatin (LIPITOR) 10 MG tablet Take 5 mg by mouth daily.   Calcium Carbonate-Vit D-Min (CALCIUM 1200 PO) Take 600 mg by mouth daily.   diltiazem (CARDIZEM CD) 180 MG 24 hr capsule Take 1 capsule (180 mg total) by mouth daily.   docusate sodium (COLACE) 100 MG capsule Take 100 mg by mouth daily.   Dulaglutide (TRULICITY [place]) Inject 3 mg into the skin every Monday.   ferrous sulfate 325 (65 FE) MG tablet Take 325 mg by mouth 3 (three) times a week.   hydrochlorothiazide (HYDRODIURIL) 25 MG tablet Take 25 mg by mouth every morning.   levothyroxine (SYNTHROID, LEVOTHROID) 150 MCG tablet Take 150 mcg by mouth daily before breakfast.   metFORMIN (GLUCOPHAGE) 1000 MG tablet Take 1,000 mg by mouth 2 (two) times daily with a meal.   Multiple Vitamins-Minerals (CENTRUM SILVER PO) Take 1 tablet by mouth daily.   ramipril (ALTACE) 10 MG capsule Take 10 mg by mouth every morning.   Vitamin D3 (VITAMIN D) 25 MCG tablet Take 1,000 Units by mouth daily.   [DISCONTINUED] flecainide (TAMBOCOR) 50 MG  tablet Take 1 tablet (50 mg total) by mouth 2 (two) times daily.     PAST MEDICAL HISTORY: Past Medical History:  Diagnosis Date   Arthritis    Atrial fibrillation ([place])    Diabetes mellitus without complication ([place])    Hemorrhoids    Hyperlipidemia    Hypertension    Hypothyroidism    Impingement syndrome of right shoulder     PAST SURGICAL HISTORY: Past Surgical History:  Procedure Laterality Date   BUBBLE STUDY  02/24/2022   Procedure: BUBBLE STUDY;  Surgeon: Rex Kras,  DO;  Location: [place];  Service: Cardiovascular;;   CARDIOVERSION N/A 02/24/2022   Procedure: CARDIOVERSION;  Surgeon: Rex Kras, DO;  Location: [place];  Service: Cardiovascular;  Laterality: N/A;  shocked @ 1323 '@200j'$  x1   colonscopy      ingrown toenail     1969    TEE WITHOUT CARDIOVERSION N/A 02/24/2022   Procedure: TRANSESOPHAGEAL ECHOCARDIOGRAM (TEE);  Surgeon: Rex Kras, DO;  Location: [place] [facility];  Service: Cardiovascular;  Laterality: N/A;   TONSILLECTOMY     TOTAL KNEE ARTHROPLASTY Left 12/09/2014   Procedure: LEFT TOTAL KNEE ARTHROPLASTY;  Surgeon: [name], MD;  Location: [facility];  Service: Orthopedics;  Laterality: Left;   trauma to left thumb     with surgical repair    TUBAL LIGATION      FAMILY HISTORY: The patient family history includes Arthritis/Rheumatoid in her sister; Atrial fibrillation in her sister; Congestive Heart Failure in her mother; Emphysema in her mother; Heart attack in her brother; Heart attack (age of onset: 1) in her father; Hypertension in her brother; Other in her brother and sister.  SOCIAL HISTORY:  The patient  reports that she has never smoked. She has never used smokeless tobacco. She reports that she does not drink alcohol and does not use drugs.  REVIEW OF SYSTEMS: Review of Systems  Constitutional: Negative for malaise/fatigue.  Cardiovascular:  Negative for chest pain, dyspnea on exertion, leg swelling, palpitations and syncope.  Respiratory:  Negative for shortness of breath.    PHYSICAL EXAM:    04/08/2022   11:31 AM 03/10/2022   10:37 AM 02/24/2022    2:00 PM  Vitals with BMI  Height '5\' 5"'$  '5\' 5"'$    Weight 229 lbs 231 lbs   BMI 18.29 93.71   Systolic 696 789 381  Diastolic 58 75 54  Pulse 84 87 82   CONSTITUTIONAL: Age-appropriate female, hemodynamically stable,  No acute distress.  SKIN: Skin is warm and dry. No rash noted. No cyanosis. No pallor. No jaundice HEAD: Normocephalic and atraumatic.  EYES: No  scleral icterus MOUTH/THROAT: Moist oral membranes.  NECK: No JVD present. No thyromegaly noted. No carotid bruits  CHEST Normal respiratory effort. No intercostal retractions  LUNGS: Clear to auscultation bilaterally.  No stridor. No wheezes. No rales.  CARDIOVASCULAR: Irregularly irregular, variable O1-B5, soft holosystolic murmur heard at the apex, no gallops or rubs.  ABDOMINAL: Obese, soft, nontender, nondistended, positive bowel sounds in all 4 quadrants, no apparent ascites.  EXTREMITIES: Trace bilateral peripheral edema, warm to touch, 2+ DP PT pulses. HEMATOLOGIC: No significant bruising NEUROLOGIC: Oriented to person, place, and time. Nonfocal. Normal muscle tone.  PSYCHIATRIC: Normal mood and affect. Normal behavior. Cooperative No significant change in physical examination over the last 2 weeks.  CARDIAC DATABASE: DDCV 02/24/2022: 200J x 1 converted to NSR.   EKG: 02/01/2022: Atrial fibrillation, 74 bpm, normal axis, without underlying injury pattern. 02/24/2022: NSR, 84 bpm, without  underlying ischemia or injury pattern. 03/10/2022: Atrial fibrillation, 91 bpm. 04/08/2022: Atrial fibrillation, 90 bpm, nonspecific ST-T changes.  Echocardiogram: 03/16/2022: Left ventricle cavity is normal in size.  Mild concentric remodeling of the left ventricle. Normal global wall motion. Normal LV systolic function with EF 55%. Unable to evaluate diastolic function due to atrial fibrillation.  Left atrial cavity is moderately dilated. Mild to moderate mitral regurgitation. Mild to moderate tricuspid regurgitation. Mild pulmonic regurgitation. The IVC is not well visualized.   Stress Testing: Lexiscan (Modified Bruce Protocol) Tetrofosmin stress test 03/22/2022: 1 Day Rest/Stress protocol.  Stress EKG is non-diagnostic, due to baseline ST-T changes. Exercise time 4 minutes, achieved 2.28 METS, 135% APMHR. Normal myocardial perfusion without convincing evidence of reversible myocardial ischemia  or prior infarct. Left ventricular size mildly dilated (EDV 122 cc), left ventricular wall thickness and wall motion preserved.  Calculated LVEF 63%. Low risk study.  Heart Catheterization: None  LABORATORY DATA:    Latest Ref Rng & Units 02/09/2022    9:49 AM 12/11/2014    5:18 AM 12/10/2014    5:00 AM  CBC  WBC 4.0 - 10.5 K/uL  10.7   10.5    Hemoglobin 11.1 - 15.9 g/dL 13.8   10.9   11.0    Hematocrit 34.0 - 46.6 % 41.2   35.4   36.2    Platelets 150 - 400 K/uL  175   172         Latest Ref Rng & Units 02/09/2022    9:48 AM 12/11/2014    5:18 AM 12/10/2014    5:00 AM  CMP  Glucose 70 - 99 mg/dL 141   167   157    BUN 8 - 27 mg/dL '14   16   14    '$ Creatinine 0.57 - 1.00 mg/dL 0.80   0.73   0.92    Sodium 134 - 144 mmol/L 137   138   137    Potassium 3.5 - 5.2 mmol/L 4.3   3.7   3.7    Chloride 96 - 106 mmol/L 94   104   98    CO2 20 - 29 mmol/L '24   28   29    '$ Calcium 8.7 - 10.3 mg/dL 9.9   9.0   8.8      Lipid Panel  No results found for: CHOL, TRIG, HDL, CHOLHDL, VLDL, LDLCALC, LDLDIRECT, LABVLDL  No components found for: NTPROBNP No results for input(s): PROBNP in the last 8760 hours. No results for input(s): TSH in the last 8760 hours.  BMP Recent Labs    02/09/22 0948  NA 137  K 4.3  CL 94*  CO2 24  GLUCOSE 141*  BUN 14  CREATININE 0.80  CALCIUM 9.9    HEMOGLOBIN A1C No results found for: HGBA1C, MPG  External Labs: Collected: 01/25/2022. Hemoglobin 14.4 g/dL, hematocrit 43.9%. BUN 14, creatinine 0.82 mg/dL. Sodium 141, potassium 4, chloride 102, bicarb 31. AST 21, ALT 22, alkaline phosphatase 69 Hemoglobin A1c 8.4. Total cholesterol 161, triglycerides 132, HDL 59, LDL 79, non-HDL 102. TSH 0.94   IMPRESSION:    ICD-10-CM   1. Persistent atrial fibrillation (HCC)  I48.19 EKG 12-Lead    flecainide (TAMBOCOR) 100 MG tablet    2. Long term (current) use of anticoagulants  Z79.01     3. Long term current use of antiarrhythmic drug  Z79.899  flecainide (TAMBOCOR) 100 MG tablet    4. Non-insulin dependent type 2 diabetes mellitus ([place])  E11.9     5. Benign hypertension  I10        RECOMMENDATIONS: Mylena Sedberry is a 71 y.o. Caucasian female whose past medical history and cardiac risk factors include: Hyperlipidemia, hypertension, diabetes melitis type II, depression, persistent atrial fibrillation s/p DDCV (02/24/2022).  Persistent atrial fibrillation (HCC) Rate control: Diltiazem. Rhythm control: Flecainide. Thromboembolic prophylaxis: Eliquis. CHA2DS2-VASc SCORE is 4 which correlates to 4% risk of stroke per year (age, gender, DM, HTN).  Status post TEE guided cardioversion to NSR after 200 J x 1.  Reverted back to A-fib after 2-week follow-up visit after cardioversion. Since last office visit echo and stress test results reviewed with the patient and noted above for further reference. Has tolerated initiation of flecainide and transition from metoprolol to diltiazem well without any side effects or intolerances. Has been on flecainide 50 mg p.o. twice daily since 03/30/2022.  Patient was recommended to increase the flecainide to 50 mg 2 tablets twice a day.  MAR updated. Like to see her back in 1 month to reevaluate her rhythm and to consider repeat cardioversion if she is still in A-fib. Patient is checking her blood pressures at home.  I have asked her to call me back in 2 weeks to discuss what her heart rate variability has been since up titration of flecainide.  Long term (current) use of anticoagulants Indication: Persistent atrial fibrillation. Currently on Eliquis. Does not endorse evidence of bleeding. Discussed the risks, benefits, and alternatives to oral anticoagulation.  Long term current use of antiarrhythmic drug Indication: Persistent atrial fibrillation. Currently on flecainide. Monitor for now  Non-insulin dependent type 2 diabetes mellitus ([place]) Educated the importance of glycemic  control.  Benign hypertension Office blood pressures are within acceptable range. Medications reconciled. Currently managed by primary care provider. We will follow peripherally.   FINAL MEDICATION LIST END OF ENCOUNTER: Meds ordered this encounter  Medications   flecainide (TAMBOCOR) 100 MG tablet    Sig: Take 1 tablet (100 mg total) by mouth 2 (two) times daily.    Dispense:  180 tablet    Refill:  0    Medications Discontinued During This Encounter  Medication Reason   flecainide (TAMBOCOR) 50 MG tablet      Current Outpatient Medications:    acetaminophen (TYLENOL) 500 MG tablet, Take 1,000 mg by mouth 2 (two) times daily., Disp: , Rfl:    apixaban (ELIQUIS) 5 MG TABS tablet, Take 1 tablet (5 mg total) by mouth 2 (two) times daily., Disp: 180 tablet, Rfl: 0   Ascorbic Acid (VITAMIN C PO), Take 1,000 mg by mouth daily., Disp: , Rfl:    atorvastatin (LIPITOR) 10 MG tablet, Take 5 mg by mouth daily., Disp: , Rfl:    Calcium Carbonate-Vit D-Min (CALCIUM 1200 PO), Take 600 mg by mouth daily., Disp: , Rfl:    diltiazem (CARDIZEM CD) 180 MG 24 hr capsule, Take 1 capsule (180 mg total) by mouth daily., Disp: 90 capsule, Rfl: 0   docusate sodium (COLACE) 100 MG capsule, Take 100 mg by mouth daily., Disp: , Rfl:    Dulaglutide (TRULICITY [place]), Inject 3 mg into the skin every Monday., Disp: , Rfl:    ferrous sulfate 325 (65 FE) MG tablet, Take 325 mg by mouth 3 (three) times a week., Disp: , Rfl:    hydrochlorothiazide (HYDRODIURIL) 25 MG tablet, Take 25 mg by mouth every morning., Disp: , Rfl: 0   levothyroxine (SYNTHROID, LEVOTHROID) 150 MCG tablet, Take 150 mcg by mouth daily  before breakfast., Disp: , Rfl:    metFORMIN (GLUCOPHAGE) 1000 MG tablet, Take 1,000 mg by mouth 2 (two) times daily with a meal., Disp: , Rfl:    Multiple Vitamins-Minerals (CENTRUM SILVER PO), Take 1 tablet by mouth daily., Disp: , Rfl:    ramipril (ALTACE) 10 MG capsule, Take 10 mg by mouth every morning.,  Disp: , Rfl:    Vitamin D3 (VITAMIN D) 25 MCG tablet, Take 1,000 Units by mouth daily., Disp: , Rfl:    buPROPion (WELLBUTRIN XL) 150 MG 24 hr tablet, Take 150 mg by mouth every morning., Disp: , Rfl:    flecainide (TAMBOCOR) 100 MG tablet, Take 1 tablet (100 mg total) by mouth 2 (two) times daily., Disp: 180 tablet, Rfl: 0   furosemide (LASIX) 20 MG tablet, Take 0.5 tablets (10 mg total) by mouth as needed for edema (If you gain 1 pound over 24hrs or 3 pounds over 7 days take lasix util back to dry weight.). (Patient not taking: Reported on 04/08/2022), Disp: 15 tablet, Rfl: 0  Orders Placed This Encounter  Procedures   EKG 12-Lead    There are no Patient Instructions on file for this visit.   --Continue cardiac medications as reconciled in final medication list. --Return in about 4 weeks (around 05/06/2022) for Follow up, A. fib, EKG on arrival.. Or sooner if needed. --Continue follow-up with your primary care physician regarding the management of your other chronic comorbid conditions.  Patient's questions and concerns were addressed to her satisfaction. She voices understanding of the instructions provided during this encounter.   This note was created using a voice recognition software as a result there may be grammatical errors inadvertently enclosed that do not reflect the nature of this encounter. Every attempt is made to correct such errors.  Rex Kras, [place], [facility]  Pager: [phone] Office: [phone]

## 2022-04-20 ENCOUNTER — Ambulatory Visit: Payer: Federal, State, Local not specified - PPO | Admitting: Cardiology

## 2022-04-20 DIAGNOSIS — E1169 Type 2 diabetes mellitus with other specified complication: Secondary | ICD-10-CM | POA: Diagnosis not present

## 2022-04-20 DIAGNOSIS — E669 Obesity, unspecified: Secondary | ICD-10-CM | POA: Diagnosis not present

## 2022-04-20 DIAGNOSIS — G4733 Obstructive sleep apnea (adult) (pediatric): Secondary | ICD-10-CM | POA: Diagnosis not present

## 2022-04-20 DIAGNOSIS — E039 Hypothyroidism, unspecified: Secondary | ICD-10-CM | POA: Diagnosis not present

## 2022-04-20 DIAGNOSIS — I10 Essential (primary) hypertension: Secondary | ICD-10-CM | POA: Diagnosis not present

## 2022-05-06 ENCOUNTER — Ambulatory Visit: Payer: Medicare Other | Admitting: Cardiology

## 2022-05-06 ENCOUNTER — Encounter: Payer: Self-pay | Admitting: Cardiology

## 2022-05-06 VITALS — BP 129/73 | HR 91 | Temp 97.7°F | Resp 16 | Ht 65.0 in | Wt 229.8 lb

## 2022-05-06 DIAGNOSIS — Z79899 Other long term (current) drug therapy: Secondary | ICD-10-CM | POA: Diagnosis not present

## 2022-05-06 DIAGNOSIS — I10 Essential (primary) hypertension: Secondary | ICD-10-CM

## 2022-05-06 DIAGNOSIS — G4730 Sleep apnea, unspecified: Secondary | ICD-10-CM

## 2022-05-06 DIAGNOSIS — E119 Type 2 diabetes mellitus without complications: Secondary | ICD-10-CM

## 2022-05-06 DIAGNOSIS — Z7901 Long term (current) use of anticoagulants: Secondary | ICD-10-CM

## 2022-05-06 DIAGNOSIS — I4819 Other persistent atrial fibrillation: Secondary | ICD-10-CM

## 2022-05-06 MED ORDER — DILTIAZEM HCL ER COATED BEADS 300 MG PO CP24: 300.0000 mg | ORAL_CAPSULE | Freq: Every morning | ORAL | 0 refills | Status: DC

## 2022-05-06 MED ORDER — FLECAINIDE ACETATE 100 MG PO TABS: 100.0000 mg | ORAL_TABLET | Freq: Two times a day (BID) | ORAL | 0 refills | Status: DC

## 2022-05-06 NOTE — Progress Notes (Signed)
Date:  05/06/2022   ID:  Anne Lang, DOB [birth year], MRN [id number]  PCP:  Faustino Congress, NP  Cardiologist:  Rex Kras, DO, [facility] (established care 02/01/2022)  Date: 05/06/22 Last Office Visit: 04/08/2022  Chief Complaint  Patient presents with   Atrial Fibrillation   Follow-up    HPI  Anne Lang is a 71 y.o. Caucasian female whose past medical history and cardiovascular risk factors include: Hyperlipidemia, hypertension, diabetes melitis type II, sleep apnea, depression, persistent atrial fibrillation s/p DDCV (02/24/2022).  She is referred to the office at the request of Faustino Congress, NP for evaluation of atrial flutter/fibrillation..  Noted to be in atrial flutter after her cataract surgery in December 2022 but did not seek medical attention until March 2023.  She underwent TEE guided cardioversion and converted to NSR after 200 J x 1 and at the follow-up 2-week visit she was back in A-fib.  Given her underlying depressive thoughts primary care started her on Wellbutrin and I transitioned her from metoprolol to diltiazem.  At the last office visit she was started on flecainide and she is currently on 100 mg p.o. twice daily.  Today's EKG notes rate controlled A-fib.  Clinically she feels more energetic, happier, active and moving around.  She was also diagnosed with sleep apnea and nocturnal hypoxia and will be going to DME this coming Monday on May 12, 2022 for CPAP.  FUNCTIONAL STATUS: Swimming three days a week at [facility] and bowling.    ALLERGIES: Allergies  Allergen Reactions   Empagliflozin Other (See Comments)    Other reaction(s): Yeast infections, itching   Pioglitazone Other (See Comments)    dependent edema   Sulfa Antibiotics Other (See Comments)    Urinary tract infection (1977)    MEDICATION LIST PRIOR TO VISIT: Current Meds  Medication Sig   acetaminophen (TYLENOL) 500 MG tablet Take 1,000 mg by mouth 2 (two) times daily.    apixaban (ELIQUIS) 5 MG TABS tablet Take 1 tablet (5 mg total) by mouth 2 (two) times daily.   Ascorbic Acid (VITAMIN C PO) Take 1,000 mg by mouth daily.   atorvastatin (LIPITOR) 10 MG tablet Take 5 mg by mouth daily.   buPROPion (WELLBUTRIN XL) 150 MG 24 hr tablet Take 150 mg by mouth every morning.   Calcium Carbonate-Vit D-Min (CALCIUM 1200 PO) Take 600 mg by mouth daily.   diltiazem (CARDIZEM CD) 300 MG 24 hr capsule Take 1 capsule (300 mg total) by mouth every morning.   docusate sodium (COLACE) 100 MG capsule Take 100 mg by mouth daily.   Dulaglutide (TRULICITY [place]) Inject 3 mg into the skin every Monday.   ferrous sulfate 325 (65 FE) MG tablet Take 325 mg by mouth 3 (three) times a week.   furosemide (LASIX) 20 MG tablet Take 10 mg by mouth as needed for fluid. TAKE 1/2 TABLET   hydrochlorothiazide (HYDRODIURIL) 25 MG tablet Take 25 mg by mouth every morning.   levothyroxine (SYNTHROID, LEVOTHROID) 150 MCG tablet Take 150 mcg by mouth daily before breakfast.   metFORMIN (GLUCOPHAGE) 1000 MG tablet Take 1,000 mg by mouth 2 (two) times daily with a meal.   Multiple Vitamins-Minerals (CENTRUM SILVER PO) Take 1 tablet by mouth daily.   ramipril (ALTACE) 10 MG capsule Take 10 mg by mouth every morning.   Vitamin D3 (VITAMIN D) 25 MCG tablet Take 1,000 Units by mouth daily.   [DISCONTINUED] diltiazem (CARDIZEM CD) 180 MG 24 hr capsule Take 1 capsule (180  mg total) by mouth daily.   [DISCONTINUED] flecainide (TAMBOCOR) 100 MG tablet Take 1 tablet (100 mg total) by mouth 2 (two) times daily.     PAST MEDICAL HISTORY: Past Medical History:  Diagnosis Date   Arthritis    Atrial fibrillation ([place])    Diabetes mellitus without complication ([place])    Hemorrhoids    Hyperlipidemia    Hypertension    Hypothyroidism    Impingement syndrome of right shoulder     PAST SURGICAL HISTORY: Past Surgical History:  Procedure Laterality Date   BUBBLE STUDY  02/24/2022   Procedure: BUBBLE STUDY;   Surgeon: Rex Kras, DO;  Location: [place];  Service: Cardiovascular;;   CARDIOVERSION N/A 02/24/2022   Procedure: CARDIOVERSION;  Surgeon: Rex Kras, DO;  Location: [place];  Service: Cardiovascular;  Laterality: N/A;  shocked @ 1323 '@200j'$  x1   colonscopy      ingrown toenail     1969    TEE WITHOUT CARDIOVERSION N/A 02/24/2022   Procedure: TRANSESOPHAGEAL ECHOCARDIOGRAM (TEE);  Surgeon: Rex Kras, DO;  Location: [place] [facility];  Service: Cardiovascular;  Laterality: N/A;   TONSILLECTOMY     TOTAL KNEE ARTHROPLASTY Left 12/09/2014   Procedure: LEFT TOTAL KNEE ARTHROPLASTY;  Surgeon: [name], MD;  Location: [facility];  Service: Orthopedics;  Laterality: Left;   trauma to left thumb     with surgical repair    TUBAL LIGATION      FAMILY HISTORY: The patient family history includes Arthritis/Rheumatoid in her sister; Atrial fibrillation in her sister; Congestive Heart Failure in her mother; Emphysema in her mother; Heart attack in her brother; Heart attack (age of onset: 50) in her father; Hypertension in her brother; Other in her brother and sister.  SOCIAL HISTORY:  The patient  reports that she has never smoked. She has never used smokeless tobacco. She reports that she does not drink alcohol and does not use drugs.  REVIEW OF SYSTEMS: Review of Systems  Constitutional: Negative for malaise/fatigue.  Cardiovascular:  Negative for chest pain, dyspnea on exertion, leg swelling, palpitations and syncope.  Respiratory:  Negative for shortness of breath.     PHYSICAL EXAM:    05/06/2022   11:28 AM 04/08/2022   11:31 AM 03/10/2022   10:37 AM  Vitals with BMI  Height '5\' 5"'$  '5\' 5"'$  '5\' 5"'$   Weight 229 lbs 13 oz 229 lbs 231 lbs  BMI 38.24 62.22 97.98  Systolic 921 194 174  Diastolic 73 58 75  Pulse 91 84 87   CONSTITUTIONAL: Age-appropriate female, hemodynamically stable,  No acute distress.  SKIN: Skin is warm and dry. No rash noted. No cyanosis. No pallor. No  jaundice HEENT: Normocephalic, atraumatic, no xanthelasmas, no JVP, trachea midline, moist mucous membranes. CHEST Normal respiratory effort. No intercostal retractions  LUNGS: Clear to auscultation bilaterally.  No stridor. No wheezes. No rales.  CARDIOVASCULAR: Irregularly irregular, variable Y8-X4, soft holosystolic murmur heard at the apex, no gallops or rubs.  ABDOMINAL: Obese, soft, nontender, nondistended, positive bowel sounds in all 4 quadrants, no apparent ascites.  EXTREMITIES: Trace bilateral peripheral edema, warm to touch, 2+ DP PT pulses. HEMATOLOGIC: No significant bruising NEUROLOGIC: Oriented to person, place, and time. Nonfocal. Normal muscle tone.  PSYCHIATRIC: Normal mood and affect. Normal behavior. Cooperative  CARDIAC DATABASE: DDCV 02/24/2022: 200J x 1 converted to NSR.   EKG: 02/01/2022: Atrial fibrillation, 74 bpm, normal axis, without underlying injury pattern. 02/24/2022: NSR, 84 bpm, without underlying ischemia or injury pattern. 03/10/2022: Atrial fibrillation,  91 bpm. 04/08/2022: Atrial fibrillation, 90 bpm, nonspecific ST-T changes. 05/06/2022: Atrial fibrillation, 80 bpm, QTc 438 ms.  Echocardiogram: 03/16/2022: Left ventricle cavity is normal in size.  Mild concentric remodeling of the left ventricle. Normal global wall motion. Normal LV systolic function with EF 55%. Unable to evaluate diastolic function due to atrial fibrillation.  Left atrial cavity is moderately dilated. Mild to moderate mitral regurgitation. Mild to moderate tricuspid regurgitation. Mild pulmonic regurgitation. The IVC is not well visualized.   Stress Testing: Lexiscan (Modified Bruce Protocol) Tetrofosmin stress test 03/22/2022: 1 Day Rest/Stress protocol.  Stress EKG is non-diagnostic, due to baseline ST-T changes. Exercise time 4 minutes, achieved 2.28 METS, 135% APMHR. Normal myocardial perfusion without convincing evidence of reversible myocardial ischemia or prior infarct. Left  ventricular size mildly dilated (EDV 122 cc), left ventricular wall thickness and wall motion preserved.  Calculated LVEF 63%. Low risk study.  Heart Catheterization: None  LABORATORY DATA:    Latest Ref Rng & Units 02/09/2022    9:49 AM 12/11/2014    5:18 AM 12/10/2014    5:00 AM  CBC  WBC 4.0 - 10.5 K/uL  10.7  10.5   Hemoglobin 11.1 - 15.9 g/dL 13.8  10.9  11.0   Hematocrit 34.0 - 46.6 % 41.2  35.4  36.2   Platelets 150 - 400 K/uL  175  172        Latest Ref Rng & Units 02/09/2022    9:48 AM 12/11/2014    5:18 AM 12/10/2014    5:00 AM  CMP  Glucose 70 - 99 mg/dL 141  167  157   BUN 8 - 27 mg/dL '14  16  14   '$ Creatinine 0.57 - 1.00 mg/dL 0.80  0.73  0.92   Sodium 134 - 144 mmol/L 137  138  137   Potassium 3.5 - 5.2 mmol/L 4.3  3.7  3.7   Chloride 96 - 106 mmol/L 94  104  98   CO2 20 - 29 mmol/L '24  28  29   '$ Calcium 8.7 - 10.3 mg/dL 9.9  9.0  8.8     Lipid Panel  No results found for: "CHOL", "TRIG", "HDL", "CHOLHDL", "VLDL", "LDLCALC", "LDLDIRECT", "LABVLDL"  No components found for: "NTPROBNP" No results for input(s): "PROBNP" in the last 8760 hours. No results for input(s): "TSH" in the last 8760 hours.  BMP Recent Labs    02/09/22 0948  NA 137  K 4.3  CL 94*  CO2 24  GLUCOSE 141*  BUN 14  CREATININE 0.80  CALCIUM 9.9    HEMOGLOBIN A1C No results found for: "HGBA1C", "MPG"  External Labs: Collected: 01/25/2022. Hemoglobin 14.4 g/dL, hematocrit 43.9%. BUN 14, creatinine 0.82 mg/dL. Sodium 141, potassium 4, chloride 102, bicarb 31. AST 21, ALT 22, alkaline phosphatase 69 Hemoglobin A1c 8.4. Total cholesterol 161, triglycerides 132, HDL 59, LDL 79, non-HDL 102. TSH 0.94   IMPRESSION:    ICD-10-CM   1. Persistent atrial fibrillation (HCC)  I48.19 EKG 12-Lead    flecainide (TAMBOCOR) 100 MG tablet    diltiazem (CARDIZEM CD) 300 MG 24 hr capsule    2. Long term current use of antiarrhythmic drug  Z79.899 flecainide (TAMBOCOR) 100 MG tablet    3. Long  term (current) use of anticoagulants  Z79.01     4. Sleep apnea, unspecified type  G47.30     5. Non-insulin dependent type 2 diabetes mellitus ([place])  E11.9     6. Benign hypertension  I10  RECOMMENDATIONS: Treesa Mccully is a 71 y.o. Caucasian female whose past medical history and cardiac risk factors include: Hyperlipidemia, hypertension, diabetes melitis type II, sleep apnea, depression, persistent atrial fibrillation s/p DDCV (02/24/2022).  Persistent atrial fibrillation (HCC) Rate control: Diltiazem. Rhythm control: Flecainide. Thromboembolic prophylaxis: Eliquis. CHA2DS2-VASc SCORE is 4 which correlates to 4% risk of stroke per year (age, gender, DM, HTN).  Status post TEE guided DCCV to NSR after 200 J x 1.  Reverted back to A-fib noted at  2-week follow-up.  Echo notes preserved LVEF and stress test reported to be low risk with normal myocardial perfusion. Tolerating flecainide 100 mg p.o. twice daily along with calcium channel blocker. We will increase diltiazem from 180 mg p.o. daily to 300 mg p.o. daily. Medication sent to [facility] Recently diagnosed with sleep apnea and nocturnal hypoxia currently follows with Dr. Reece Levy.  Strongly encouraged her to have her sleep apnea and nocturnal hypoxia corrected. I would like her to be on her CPAP for at least 4 weeks and to reevaluate.  If she remains in A-fib we discussed proceeding with direct-current cardioversion.  Long term (current) use of anticoagulants Indication: Persistent atrial fibrillation. Currently on Eliquis. Does not endorse evidence of bleeding. Discussed the risks, benefits, and alternatives to oral anticoagulation.  Long term current use of antiarrhythmic drug Indication: Persistent atrial fibrillation. Refill flecainide 100 mg p.o. twice daily  Non-insulin dependent type 2 diabetes mellitus ([place]) Educated the importance of glycemic control.  Benign hypertension Office blood pressures are  within acceptable range. Medications reconciled. Currently managed by primary care provider. We will follow peripherally.   FINAL MEDICATION LIST END OF ENCOUNTER: Meds ordered this encounter  Medications   flecainide (TAMBOCOR) 100 MG tablet    Sig: Take 1 tablet (100 mg total) by mouth 2 (two) times daily.    Dispense:  180 tablet    Refill:  0   diltiazem (CARDIZEM CD) 300 MG 24 hr capsule    Sig: Take 1 capsule (300 mg total) by mouth every morning.    Dispense:  90 capsule    Refill:  0    Medications Discontinued During This Encounter  Medication Reason   furosemide (LASIX) 20 MG tablet    diltiazem (CARDIZEM CD) 180 MG 24 hr capsule Dose change   flecainide (TAMBOCOR) 100 MG tablet Reorder     Current Outpatient Medications:    acetaminophen (TYLENOL) 500 MG tablet, Take 1,000 mg by mouth 2 (two) times daily., Disp: , Rfl:    apixaban (ELIQUIS) 5 MG TABS tablet, Take 1 tablet (5 mg total) by mouth 2 (two) times daily., Disp: 180 tablet, Rfl: 0   Ascorbic Acid (VITAMIN C PO), Take 1,000 mg by mouth daily., Disp: , Rfl:    atorvastatin (LIPITOR) 10 MG tablet, Take 5 mg by mouth daily., Disp: , Rfl:    buPROPion (WELLBUTRIN XL) 150 MG 24 hr tablet, Take 150 mg by mouth every morning., Disp: , Rfl:    Calcium Carbonate-Vit D-Min (CALCIUM 1200 PO), Take 600 mg by mouth daily., Disp: , Rfl:    diltiazem (CARDIZEM CD) 300 MG 24 hr capsule, Take 1 capsule (300 mg total) by mouth every morning., Disp: 90 capsule, Rfl: 0   docusate sodium (COLACE) 100 MG capsule, Take 100 mg by mouth daily., Disp: , Rfl:    Dulaglutide (TRULICITY [place]), Inject 3 mg into the skin every Monday., Disp: , Rfl:    ferrous sulfate 325 (65 FE) MG tablet, Take 325 mg  by mouth 3 (three) times a week., Disp: , Rfl:    furosemide (LASIX) 20 MG tablet, Take 10 mg by mouth as needed for fluid. TAKE 1/2 TABLET, Disp: , Rfl:    hydrochlorothiazide (HYDRODIURIL) 25 MG tablet, Take 25 mg by mouth every morning., Disp:  , Rfl: 0   levothyroxine (SYNTHROID, LEVOTHROID) 150 MCG tablet, Take 150 mcg by mouth daily before breakfast., Disp: , Rfl:    metFORMIN (GLUCOPHAGE) 1000 MG tablet, Take 1,000 mg by mouth 2 (two) times daily with a meal., Disp: , Rfl:    Multiple Vitamins-Minerals (CENTRUM SILVER PO), Take 1 tablet by mouth daily., Disp: , Rfl:    ramipril (ALTACE) 10 MG capsule, Take 10 mg by mouth every morning., Disp: , Rfl:    Vitamin D3 (VITAMIN D) 25 MCG tablet, Take 1,000 Units by mouth daily., Disp: , Rfl:    flecainide (TAMBOCOR) 100 MG tablet, Take 1 tablet (100 mg total) by mouth 2 (two) times daily., Disp: 180 tablet, Rfl: 0  Orders Placed This Encounter  Procedures   EKG 12-Lead    There are no Patient Instructions on file for this visit.   --Continue cardiac medications as reconciled in final medication list. --Return in about 6 weeks (around 06/17/2022) for Follow up, A. fib, Review test results, EKG on arrival.. Or sooner if needed. --Continue follow-up with your primary care physician regarding the management of your other chronic comorbid conditions.  Patient's questions and concerns were addressed to her satisfaction. She voices understanding of the instructions provided during this encounter.   This note was created using a voice recognition software as a result there may be grammatical errors inadvertently enclosed that do not reflect the nature of this encounter. Every attempt is made to correct such errors.  Rex Kras, [place], [facility]  Pager: [phone] Office: [phone]

## 2022-05-12 ENCOUNTER — Telehealth: Payer: Self-pay | Admitting: Cardiology

## 2022-05-12 ENCOUNTER — Other Ambulatory Visit: Payer: Self-pay | Admitting: Cardiology

## 2022-05-12 DIAGNOSIS — I4819 Other persistent atrial fibrillation: Secondary | ICD-10-CM

## 2022-05-12 DIAGNOSIS — Z79899 Other long term (current) drug therapy: Secondary | ICD-10-CM

## 2022-05-12 MED ORDER — FLECAINIDE ACETATE 100 MG PO TABS: 100.0000 mg | ORAL_TABLET | Freq: Two times a day (BID) | ORAL | 0 refills | Status: DC

## 2022-05-12 NOTE — Telephone Encounter (Signed)
Pt wants to know if it's safe to have a Cortisone shot with all the medications that she is taking. Please, advise.

## 2022-05-13 ENCOUNTER — Other Ambulatory Visit: Payer: Self-pay | Admitting: Cardiology

## 2022-05-13 DIAGNOSIS — I4819 Other persistent atrial fibrillation: Secondary | ICD-10-CM

## 2022-05-13 DIAGNOSIS — Z79899 Other long term (current) drug therapy: Secondary | ICD-10-CM

## 2022-05-13 NOTE — Telephone Encounter (Signed)
That should be fine.   Regards,   Dr. Terri Skains

## 2022-05-14 ENCOUNTER — Other Ambulatory Visit: Payer: Self-pay

## 2022-05-14 DIAGNOSIS — Z79899 Other long term (current) drug therapy: Secondary | ICD-10-CM

## 2022-05-14 DIAGNOSIS — I4819 Other persistent atrial fibrillation: Secondary | ICD-10-CM

## 2022-05-14 MED ORDER — FLECAINIDE ACETATE 100 MG PO TABS: 100.0000 mg | ORAL_TABLET | Freq: Two times a day (BID) | ORAL | 0 refills | Status: DC

## 2022-05-24 DIAGNOSIS — Z961 Presence of intraocular lens: Secondary | ICD-10-CM | POA: Diagnosis not present

## 2022-05-24 DIAGNOSIS — H524 Presbyopia: Secondary | ICD-10-CM | POA: Diagnosis not present

## 2022-05-24 DIAGNOSIS — E119 Type 2 diabetes mellitus without complications: Secondary | ICD-10-CM | POA: Diagnosis not present

## 2022-05-27 DIAGNOSIS — Z7985 Long-term (current) use of injectable non-insulin antidiabetic drugs: Secondary | ICD-10-CM | POA: Diagnosis not present

## 2022-05-27 DIAGNOSIS — Z79899 Other long term (current) drug therapy: Secondary | ICD-10-CM | POA: Diagnosis not present

## 2022-05-27 DIAGNOSIS — G4733 Obstructive sleep apnea (adult) (pediatric): Secondary | ICD-10-CM | POA: Diagnosis not present

## 2022-05-27 DIAGNOSIS — E039 Hypothyroidism, unspecified: Secondary | ICD-10-CM | POA: Diagnosis not present

## 2022-05-27 DIAGNOSIS — E1165 Type 2 diabetes mellitus with hyperglycemia: Secondary | ICD-10-CM | POA: Diagnosis not present

## 2022-05-27 DIAGNOSIS — Z7984 Long term (current) use of oral hypoglycemic drugs: Secondary | ICD-10-CM | POA: Diagnosis not present

## 2022-05-27 DIAGNOSIS — I10 Essential (primary) hypertension: Secondary | ICD-10-CM | POA: Diagnosis not present

## 2022-05-27 DIAGNOSIS — D509 Iron deficiency anemia, unspecified: Secondary | ICD-10-CM | POA: Diagnosis not present

## 2022-05-27 DIAGNOSIS — I499 Cardiac arrhythmia, unspecified: Secondary | ICD-10-CM | POA: Diagnosis not present

## 2022-05-27 DIAGNOSIS — I4891 Unspecified atrial fibrillation: Secondary | ICD-10-CM | POA: Diagnosis not present

## 2022-05-27 DIAGNOSIS — F331 Major depressive disorder, recurrent, moderate: Secondary | ICD-10-CM | POA: Diagnosis not present

## 2022-05-27 DIAGNOSIS — Z7901 Long term (current) use of anticoagulants: Secondary | ICD-10-CM | POA: Diagnosis not present

## 2022-05-27 DIAGNOSIS — E7800 Pure hypercholesterolemia, unspecified: Secondary | ICD-10-CM | POA: Diagnosis not present

## 2022-06-03 ENCOUNTER — Telehealth: Payer: Self-pay

## 2022-06-03 NOTE — Telephone Encounter (Signed)
Received call from pt inquiring about PREP program; explained program, she gave permission to review her chart/medical record; will need referral from provider; she wants to think about it and if she decides to pursue, will reach out to cardiologist for referral

## 2022-06-07 ENCOUNTER — Telehealth: Payer: Self-pay

## 2022-06-07 NOTE — Telephone Encounter (Signed)
She called to let me know that she has decided not to pursue attending PREP at this time, may wait until after the first of the year; will contact me again when she is ready to attend/participate.

## 2022-06-08 DIAGNOSIS — M79644 Pain in right finger(s): Secondary | ICD-10-CM | POA: Diagnosis not present

## 2022-06-08 DIAGNOSIS — M25522 Pain in left elbow: Secondary | ICD-10-CM | POA: Diagnosis not present

## 2022-06-08 DIAGNOSIS — M65341 Trigger finger, right ring finger: Secondary | ICD-10-CM | POA: Diagnosis not present

## 2022-06-11 ENCOUNTER — Encounter: Payer: Self-pay | Admitting: Cardiology

## 2022-06-11 ENCOUNTER — Ambulatory Visit: Payer: Medicare Other | Admitting: Cardiology

## 2022-06-11 VITALS — BP 122/82 | HR 88 | Temp 97.8°F | Resp 16 | Ht 65.0 in | Wt 228.6 lb

## 2022-06-11 DIAGNOSIS — Z7901 Long term (current) use of anticoagulants: Secondary | ICD-10-CM

## 2022-06-11 DIAGNOSIS — I4819 Other persistent atrial fibrillation: Secondary | ICD-10-CM

## 2022-06-11 DIAGNOSIS — Z79899 Other long term (current) drug therapy: Secondary | ICD-10-CM

## 2022-06-11 MED ORDER — APIXABAN 5 MG PO TABS: 5.0000 mg | ORAL_TABLET | Freq: Two times a day (BID) | ORAL | 0 refills | Status: DC

## 2022-06-11 MED ORDER — DILTIAZEM HCL ER COATED BEADS 360 MG PO CP24: 360.0000 mg | ORAL_CAPSULE | Freq: Every morning | ORAL | 0 refills | Status: DC

## 2022-06-11 MED ORDER — FLECAINIDE ACETATE 100 MG PO TABS: 100.0000 mg | ORAL_TABLET | Freq: Two times a day (BID) | ORAL | 0 refills | Status: DC

## 2022-06-11 NOTE — Progress Notes (Signed)
Date:  06/11/2022   ID:  [name], DOB 08/05/1951, MRN [id number]  PCP:  Faustino Congress, NP  Cardiologist:  Rex Kras, DO, [facility] (established care 02/01/2022)  Date: 06/11/22 Last Office Visit: 05/06/2022  Chief Complaint  Patient presents with   Atrial Fibrillation   Results   Follow-up    HPI  Anne Lang is a 71 y.o. Caucasian female whose past medical history and cardiovascular risk factors include: Hyperlipidemia, hypertension, diabetes melitis type II, sleep apnea, depression, persistent atrial fibrillation s/p DDCV (02/24/2022).  December 2023 prior to her cataract surgery she was noted to be in atrial flutter; however, did not seek medical attention until March 2023.  She underwent TEE guided cardioversion and normal sinus rhythm was restored with 200 J x 1.  However, at her 2-week follow-up visit after the cardioversion she was back in A-fib.  Since then her AV nodal blocking medications have been uptitrated, and started on flecainide and uptitrated to 100 mg p.o. twice daily, she has been evaluated for sleep apnea now on CPAP.  Of note metoprolol was transitioned to diltiazem due to depressive thoughts.  Since last office visit patient states that she has been noticing paroxysmal episodes of sinus rhythm with her ventricular rate is around 60 beats per minute.  She was in normal rhythm for 9 days according to her but reverted back to A-fib over the last 48 hours.  She been compliant with her medical therapy and requesting refills.  FUNCTIONAL STATUS: Swimming three days a week at [facility] and bowling.    ALLERGIES: Allergies  Allergen Reactions   Empagliflozin Other (See Comments)    Other reaction(s): Yeast infections, itching   Pioglitazone Other (See Comments)    dependent edema   Sulfa Antibiotics Other (See Comments)    Urinary tract infection (1977)    MEDICATION LIST PRIOR TO VISIT: Current Meds  Medication Sig   acetaminophen (TYLENOL)  500 MG tablet Take 1,000 mg by mouth 2 (two) times daily.   Ascorbic Acid (VITAMIN C PO) Take 1,000 mg by mouth daily.   atorvastatin (LIPITOR) 10 MG tablet Take 5 mg by mouth daily.   buPROPion (WELLBUTRIN XL) 150 MG 24 hr tablet Take 150 mg by mouth every morning.   Calcium Carbonate-Vit D-Min (CALCIUM 1200 PO) Take 600 mg by mouth daily.   docusate sodium (COLACE) 100 MG capsule Take 100 mg by mouth daily.   Dulaglutide (TRULICITY [place]) Inject 3 mg into the skin every Monday.   ferrous sulfate 325 (65 FE) MG tablet Take 325 mg by mouth 3 (three) times a week.   furosemide (LASIX) 20 MG tablet Take 10 mg by mouth as needed for fluid. TAKE 1/2 TABLET   hydrochlorothiazide (HYDRODIURIL) 25 MG tablet Take 25 mg by mouth every morning.   levothyroxine (SYNTHROID, LEVOTHROID) 150 MCG tablet Take 150 mcg by mouth daily before breakfast.   metFORMIN (GLUCOPHAGE) 1000 MG tablet Take 1,000 mg by mouth 2 (two) times daily with a meal.   Multiple Vitamins-Minerals (CENTRUM SILVER PO) Take 1 tablet by mouth daily.   ramipril (ALTACE) 10 MG capsule Take 10 mg by mouth every morning.   Vitamin D3 (VITAMIN D) 25 MCG tablet Take 1,000 Units by mouth daily.   [DISCONTINUED] apixaban (ELIQUIS) 5 MG TABS tablet Take 1 tablet (5 mg total) by mouth 2 (two) times daily.   [DISCONTINUED] diltiazem (CARDIZEM CD) 300 MG 24 hr capsule Take 1 capsule (300 mg total) by mouth every morning.   [  DISCONTINUED] flecainide (TAMBOCOR) 100 MG tablet Take 1 tablet (100 mg total) by mouth 2 (two) times daily.     PAST MEDICAL HISTORY: Past Medical History:  Diagnosis Date   Arthritis    Atrial fibrillation ([place])    Diabetes mellitus without complication ([place])    Hemorrhoids    Hyperlipidemia    Hypertension    Hypothyroidism    Impingement syndrome of right shoulder     PAST SURGICAL HISTORY: Past Surgical History:  Procedure Laterality Date   BUBBLE STUDY  02/24/2022   Procedure: BUBBLE STUDY;  Surgeon: Rex Kras,  DO;  Location: [facility];  Service: Cardiovascular;;   CARDIOVERSION N/A 02/24/2022   Procedure: CARDIOVERSION;  Surgeon: Rex Kras, DO;  Location: [facility];  Service: Cardiovascular;  Laterality: N/A;  shocked @ 1323 '@200j'$  x1   colonscopy      ingrown toenail     1969    TEE WITHOUT CARDIOVERSION N/A 02/24/2022   Procedure: TRANSESOPHAGEAL ECHOCARDIOGRAM (TEE);  Surgeon: Rex Kras, DO;  Location: [facility];  Service: Cardiovascular;  Laterality: N/A;   TONSILLECTOMY     TOTAL KNEE ARTHROPLASTY Left 12/09/2014   Procedure: LEFT TOTAL KNEE ARTHROPLASTY;  Surgeon: [name], MD;  Location: [facility];  Service: Orthopedics;  Laterality: Left;   trauma to left thumb     with surgical repair    TUBAL LIGATION      FAMILY HISTORY: The patient family history includes Arthritis/Rheumatoid in her sister; Atrial fibrillation in her sister; Congestive Heart Failure in her mother; Emphysema in her mother; Heart attack in her brother; Heart attack (age of onset: 47) in her father; Hypertension in her brother; Other in her brother and sister.  SOCIAL HISTORY:  The patient  reports that she has never smoked. She has never used smokeless tobacco. She reports that she does not drink alcohol and does not use drugs.  REVIEW OF SYSTEMS: Review of Systems  Constitutional: Negative for malaise/fatigue.  Cardiovascular:  Negative for chest pain, dyspnea on exertion, leg swelling, palpitations and syncope.  Respiratory:  Negative for shortness of breath.     PHYSICAL EXAM:    06/11/2022   12:02 PM 06/11/2022   11:54 AM 05/06/2022   11:28 AM  Vitals with BMI  Height  '5\' 5"'$  '5\' 5"'$   Weight  228 lbs 10 oz 229 lbs 13 oz  BMI  59.93 57.01  Systolic 779 390 300  Diastolic 82 81 73  Pulse 88 91 91   CONSTITUTIONAL: Age-appropriate female, hemodynamically stable,  No acute distress.  SKIN: Skin is warm and dry. No rash noted. No cyanosis. No pallor. No jaundice HEENT: Normocephalic, atraumatic,  no xanthelasmas, no JVP, trachea midline, moist mucous membranes. CHEST Normal respiratory effort. No intercostal retractions  LUNGS: Clear to auscultation bilaterally.  No stridor. No wheezes. No rales.  CARDIOVASCULAR: Irregularly irregular, variable P2-Z3, soft holosystolic murmur at the apex, no gallops or rubs.   ABDOMINAL: Obese, soft, nontender, nondistended, positive bowel sounds in all 4 quadrants, no apparent ascites.  EXTREMITIES: Trace bilateral peripheral edema, warm to touch, 2+ DP PT pulses. HEMATOLOGIC: No significant bruising NEUROLOGIC: Oriented to person, place, and time. Nonfocal. Normal muscle tone.  PSYCHIATRIC: Normal mood and affect. Normal behavior. Cooperative  CARDIAC DATABASE: DDCV 02/24/2022: 200J x 1 converted to NSR.   EKG: 02/01/2022: Atrial fibrillation, 74 bpm, normal axis, without underlying injury pattern. 02/24/2022: NSR, 84 bpm, without underlying ischemia or injury pattern. June 11, 2022: Atrial fibrillation, 95 bpm, normal axis, without  underlying injury pattern.  Echocardiogram: 03/16/2022: Left ventricle cavity is normal in size.  Mild concentric remodeling of the left ventricle. Normal global wall motion. Normal LV systolic function with EF 55%. Unable to evaluate diastolic function due to atrial fibrillation.  Left atrial cavity is moderately dilated. Mild to moderate mitral regurgitation. Mild to moderate tricuspid regurgitation. Mild pulmonic regurgitation. The IVC is not well visualized.   Stress Testing: Lexiscan (Modified Bruce Protocol) Tetrofosmin stress test 03/22/2022: 1 Day Rest/Stress protocol.  Stress EKG is non-diagnostic, due to baseline ST-T changes. Exercise time 4 minutes, achieved 2.28 METS, 135% APMHR. Normal myocardial perfusion without convincing evidence of reversible myocardial ischemia or prior infarct. Left ventricular size mildly dilated (EDV 122 cc), left ventricular wall thickness and wall motion preserved.   Calculated LVEF 63%. Low risk study.  Heart Catheterization: None  LABORATORY DATA:    Latest Ref Rng & Units 02/09/2022    9:49 AM 12/11/2014    5:18 AM 12/10/2014    5:00 AM  CBC  WBC 4.0 - 10.5 K/uL  10.7  10.5   Hemoglobin 11.1 - 15.9 g/dL 13.8  10.9  11.0   Hematocrit 34.0 - 46.6 % 41.2  35.4  36.2   Platelets 150 - 400 K/uL  175  172        Latest Ref Rng & Units 02/09/2022    9:48 AM 12/11/2014    5:18 AM 12/10/2014    5:00 AM  CMP  Glucose 70 - 99 mg/dL 141  167  157   BUN 8 - 27 mg/dL '14  16  14   '$ Creatinine 0.57 - 1.00 mg/dL 0.80  0.73  0.92   Sodium 134 - 144 mmol/L 137  138  137   Potassium 3.5 - 5.2 mmol/L 4.3  3.7  3.7   Chloride 96 - 106 mmol/L 94  104  98   CO2 20 - 29 mmol/L '24  28  29   '$ Calcium 8.7 - 10.3 mg/dL 9.9  9.0  8.8     Lipid Panel  No results found for: "CHOL", "TRIG", "HDL", "CHOLHDL", "VLDL", "LDLCALC", "LDLDIRECT", "LABVLDL"  No components found for: "NTPROBNP" No results for input(s): "PROBNP" in the last 8760 hours. No results for input(s): "TSH" in the last 8760 hours.  BMP Recent Labs    02/09/22 0948  NA 137  K 4.3  CL 94*  CO2 24  GLUCOSE 141*  BUN 14  CREATININE 0.80  CALCIUM 9.9    HEMOGLOBIN A1C No results found for: "HGBA1C", "MPG"  External Labs: Collected: 01/25/2022. Hemoglobin 14.4 g/dL, hematocrit 43.9%. BUN 14, creatinine 0.82 mg/dL. Sodium 141, potassium 4, chloride 102, bicarb 31. AST 21, ALT 22, alkaline phosphatase 69 Hemoglobin A1c 8.4. Total cholesterol 161, triglycerides 132, HDL 59, LDL 79, non-HDL 102. TSH 0.94   IMPRESSION:    ICD-10-CM   1. Persistent atrial fibrillation (HCC)  I48.19 EKG 12-Lead    diltiazem (CARDIZEM CD) 360 MG 24 hr capsule    flecainide (TAMBOCOR) 100 MG tablet    DISCONTINUED: flecainide (TAMBOCOR) 100 MG tablet    2. Long term (current) use of anticoagulants  Z79.01 apixaban (ELIQUIS) 5 MG TABS tablet    3. Long term current use of antiarrhythmic drug  Z79.899  flecainide (TAMBOCOR) 100 MG tablet    DISCONTINUED: flecainide (TAMBOCOR) 100 MG tablet       RECOMMENDATIONS: Anne Lang is a 71 y.o. Caucasian female whose past medical history and cardiac risk factors include: Hyperlipidemia, hypertension,  diabetes melitis type II, sleep apnea, depression, persistent atrial fibrillation s/p DDCV (02/24/2022).  Persistent atrial fibrillation (HCC) Rate control: Diltiazem. Rhythm control: Flecainide. Thromboembolic prophylaxis: Eliquis CHA2DS2-VASc SCORE is 4 which correlates to 4% risk of stroke per year (age, gender, DM, HTN).  Underwent TEE guided cardioversion and converted to NSR after 200 J x 1 but reverted back to A-fib by her 2-week follow-up visit. Since last visit she has been having paroxysmal episodes of SR but having difficulties maintaining SR. We will increase diltiazem to 360 mg p.o. daily. Refill for Eliquis, flecainide, and diltiazem sent to her pharmacy. I have asked her to come in for EKG check in 2 weeks and if she still remains in A-fib the plan is to proceed with direct-current cardioversion. I have also discussed alternative options such as EP evaluation for possible atrial fibrillation ablation.  Patient will discuss both cardioversion and ablation options with family in the interim.  Long term (current) use of anticoagulants Indication: Persistent atrial fibrillation. Currently on Eliquis. Does not endorse evidence of bleeding. Discussed the risks, benefits, and alternatives to oral anticoagulation.  Non-insulin dependent type 2 diabetes mellitus ([place]) Educated the importance of glycemic control.  Benign hypertension Office blood pressures are within acceptable range. Medications reconciled. Currently managed by primary care provider. We will follow peripherally.   FINAL MEDICATION LIST END OF ENCOUNTER: Meds ordered this encounter  Medications   apixaban (ELIQUIS) 5 MG TABS tablet    Sig: Take 1 tablet (5  mg total) by mouth 2 (two) times daily.    Dispense:  180 tablet    Refill:  0   diltiazem (CARDIZEM CD) 360 MG 24 hr capsule    Sig: Take 1 capsule (360 mg total) by mouth every morning.    Dispense:  90 capsule    Refill:  0   DISCONTD: flecainide (TAMBOCOR) 100 MG tablet    Sig: Take 1 tablet (100 mg total) by mouth 2 (two) times daily.    Dispense:  180 tablet    Refill:  0   flecainide (TAMBOCOR) 100 MG tablet    Sig: Take 1 tablet (100 mg total) by mouth 2 (two) times daily.    Dispense:  60 tablet    Refill:  0    Medications Discontinued During This Encounter  Medication Reason   apixaban (ELIQUIS) 5 MG TABS tablet Reorder   diltiazem (CARDIZEM CD) 300 MG 24 hr capsule Reorder   flecainide (TAMBOCOR) 100 MG tablet Reorder   flecainide (TAMBOCOR) 100 MG tablet Reorder     Current Outpatient Medications:    acetaminophen (TYLENOL) 500 MG tablet, Take 1,000 mg by mouth 2 (two) times daily., Disp: , Rfl:    Ascorbic Acid (VITAMIN C PO), Take 1,000 mg by mouth daily., Disp: , Rfl:    atorvastatin (LIPITOR) 10 MG tablet, Take 5 mg by mouth daily., Disp: , Rfl:    buPROPion (WELLBUTRIN XL) 150 MG 24 hr tablet, Take 150 mg by mouth every morning., Disp: , Rfl:    Calcium Carbonate-Vit D-Min (CALCIUM 1200 PO), Take 600 mg by mouth daily., Disp: , Rfl:    docusate sodium (COLACE) 100 MG capsule, Take 100 mg by mouth daily., Disp: , Rfl:    Dulaglutide (TRULICITY [place]), Inject 3 mg into the skin every Monday., Disp: , Rfl:    ferrous sulfate 325 (65 FE) MG tablet, Take 325 mg by mouth 3 (three) times a week., Disp: , Rfl:    furosemide (LASIX) 20 MG tablet,  Take 10 mg by mouth as needed for fluid. TAKE 1/2 TABLET, Disp: , Rfl:    hydrochlorothiazide (HYDRODIURIL) 25 MG tablet, Take 25 mg by mouth every morning., Disp: , Rfl: 0   levothyroxine (SYNTHROID, LEVOTHROID) 150 MCG tablet, Take 150 mcg by mouth daily before breakfast., Disp: , Rfl:    metFORMIN (GLUCOPHAGE) 1000 MG tablet,  Take 1,000 mg by mouth 2 (two) times daily with a meal., Disp: , Rfl:    Multiple Vitamins-Minerals (CENTRUM SILVER PO), Take 1 tablet by mouth daily., Disp: , Rfl:    ramipril (ALTACE) 10 MG capsule, Take 10 mg by mouth every morning., Disp: , Rfl:    Vitamin D3 (VITAMIN D) 25 MCG tablet, Take 1,000 Units by mouth daily., Disp: , Rfl:    apixaban (ELIQUIS) 5 MG TABS tablet, Take 1 tablet (5 mg total) by mouth 2 (two) times daily., Disp: 180 tablet, Rfl: 0   diltiazem (CARDIZEM CD) 360 MG 24 hr capsule, Take 1 capsule (360 mg total) by mouth every morning., Disp: 90 capsule, Rfl: 0   flecainide (TAMBOCOR) 100 MG tablet, Take 1 tablet (100 mg total) by mouth 2 (two) times daily., Disp: 60 tablet, Rfl: 0  Orders Placed This Encounter  Procedures   EKG 12-Lead    There are no Patient Instructions on file for this visit.   --Continue cardiac medications as reconciled in final medication list. --Return in about 6 weeks (around 07/23/2022) for Follow up, A. fib. Or sooner if needed. --Continue follow-up with your primary care physician regarding the management of your other chronic comorbid conditions.  Patient's questions and concerns were addressed to her satisfaction. She voices understanding of the instructions provided during this encounter.   This note was created using a voice recognition software as a result there may be grammatical errors inadvertently enclosed that do not reflect the nature of this encounter. Every attempt is made to correct such errors.  Rex Kras, [place], [facility]  Pager: [phone] Office: [phone]

## 2022-06-24 ENCOUNTER — Ambulatory Visit: Payer: Federal, State, Local not specified - PPO | Admitting: Cardiology

## 2022-06-29 ENCOUNTER — Ambulatory Visit: Payer: Medicare Other | Admitting: Cardiology

## 2022-06-29 ENCOUNTER — Other Ambulatory Visit: Payer: Self-pay

## 2022-06-29 DIAGNOSIS — I10 Essential (primary) hypertension: Secondary | ICD-10-CM

## 2022-06-29 DIAGNOSIS — I4819 Other persistent atrial fibrillation: Secondary | ICD-10-CM | POA: Diagnosis not present

## 2022-07-04 NOTE — Progress Notes (Signed)
EKG visit:  Seeing the patient at today's nurse visit.   At the last office visit on 06/11/2022 the shared decision was to increase diltiazem to 360 mg p.o. daily.  She is here for 2-week follow-up EKG which illustrates atrial fibrillation with controlled ventricular rate.  Shared decision was to proceed with direct-current cardioversion only without TEE.  Risks, benefits, and alternatives of direct current cardioversion reviewed with the and patient. Risk includes but not limited to: potential for post-cardioversion rhythms, life-threatening arrhythmias (ventricular tachycardia and fibrillation, profound bradycardia, cardiac arrest), myocardial damage, acute pulmonary edema, skin burns, transient hypotension. Benefits include restoration of sinus rhythm. Alternatives to treatment were discussed, questions were answered, patient voices understanding and provides verbal feedback.  Patient is willing to proceed.   Rex Kras, [place], [facility]  Pager: [phone] Office: [phone]

## 2022-07-05 DIAGNOSIS — I4819 Other persistent atrial fibrillation: Secondary | ICD-10-CM | POA: Diagnosis not present

## 2022-07-05 DIAGNOSIS — I10 Essential (primary) hypertension: Secondary | ICD-10-CM | POA: Diagnosis not present

## 2022-07-06 ENCOUNTER — Encounter (HOSPITAL_COMMUNITY): Payer: Self-pay | Admitting: Cardiology

## 2022-07-06 LAB — CMP14+EGFR
ALT: 16 IU/L (ref 0–32)
AST: 17 IU/L (ref 0–40)
Albumin/Globulin Ratio: 1.9 (ref 1.2–2.2)
Albumin: 4.6 g/dL (ref 3.8–4.8)
Alkaline Phosphatase: 93 IU/L (ref 44–121)
BUN/Creatinine Ratio: 16 (ref 12–28)
BUN: 13 mg/dL (ref 8–27)
Bilirubin Total: 0.2 mg/dL (ref 0.0–1.2)
CO2: 27 mmol/L (ref 20–29)
Calcium: 10 mg/dL (ref 8.7–10.3)
Chloride: 97 mmol/L (ref 96–106)
Creatinine, Ser: 0.8 mg/dL (ref 0.57–1.00)
Globulin, Total: 2.4 g/dL (ref 1.5–4.5)
Glucose: 117 mg/dL — ABNORMAL HIGH (ref 70–99)
Potassium: 4 mmol/L (ref 3.5–5.2)
Sodium: 138 mmol/L (ref 134–144)
Total Protein: 7 g/dL (ref 6.0–8.5)
eGFR: 78 mL/min/{1.73_m2} (ref >59)

## 2022-07-06 LAB — CBC WITH DIFFERENTIAL/PLATELET
Basophils Absolute: 0 10*3/uL (ref 0.0–0.2)
Basos: 0 %
EOS (ABSOLUTE): 0.1 10*3/uL (ref 0.0–0.4)
Eos: 1 %
Hematocrit: 40.8 % (ref 34.0–46.6)
Hemoglobin: 13.7 g/dL (ref 11.1–15.9)
Immature Grans (Abs): 0 10*3/uL (ref 0.0–0.1)
Immature Granulocytes: 0 %
Lymphocytes Absolute: 2 10*3/uL (ref 0.7–3.1)
Lymphs: 23 %
MCH: 29 pg (ref 26.6–33.0)
MCHC: 33.6 g/dL (ref 31.5–35.7)
MCV: 86 fL (ref 79–97)
Monocytes Absolute: 0.6 10*3/uL (ref 0.1–0.9)
Monocytes: 7 %
Neutrophils Absolute: 6.1 10*3/uL (ref 1.4–7.0)
Neutrophils: 69 %
Platelets: 237 10*3/uL (ref 150–450)
RBC: 4.72 x10E6/uL (ref 3.77–5.28)
RDW: 13.9 % (ref 11.7–15.4)
WBC: 8.8 10*3/uL (ref 3.4–10.8)

## 2022-07-06 NOTE — Progress Notes (Signed)
Attempted to obtain medical history via telephone, unable to reach at this time. HIPAA compliant voicemail message left requesting return call to pre surgical testing department. 

## 2022-07-21 ENCOUNTER — Encounter (HOSPITAL_COMMUNITY): Payer: Self-pay | Admitting: Cardiology

## 2022-07-22 DIAGNOSIS — E669 Obesity, unspecified: Secondary | ICD-10-CM | POA: Diagnosis not present

## 2022-07-22 DIAGNOSIS — I10 Essential (primary) hypertension: Secondary | ICD-10-CM | POA: Diagnosis not present

## 2022-07-22 DIAGNOSIS — E1169 Type 2 diabetes mellitus with other specified complication: Secondary | ICD-10-CM | POA: Diagnosis not present

## 2022-07-22 DIAGNOSIS — E039 Hypothyroidism, unspecified: Secondary | ICD-10-CM | POA: Diagnosis not present

## 2022-07-22 DIAGNOSIS — G4733 Obstructive sleep apnea (adult) (pediatric): Secondary | ICD-10-CM | POA: Diagnosis not present

## 2022-07-23 ENCOUNTER — Ambulatory Visit: Payer: Federal, State, Local not specified - PPO | Admitting: Cardiology

## 2022-07-28 ENCOUNTER — Encounter (HOSPITAL_COMMUNITY): Payer: Self-pay | Admitting: Certified Registered Nurse Anesthetist

## 2022-07-28 ENCOUNTER — Ambulatory Visit (HOSPITAL_COMMUNITY)
Admission: RE | Admit: 2022-07-28 | Discharge: 2022-07-28 | Disposition: A | Discharge status: Home / Self Care | Payer: Medicare Other | Attending: Cardiology | Admitting: Cardiology

## 2022-07-28 ENCOUNTER — Encounter (HOSPITAL_COMMUNITY)
Admission: RE | Disposition: A | Discharge status: Home / Self Care | Payer: Self-pay | Source: Home / Self Care | Attending: Cardiology

## 2022-07-28 DIAGNOSIS — I4819 Other persistent atrial fibrillation: Secondary | ICD-10-CM | POA: Diagnosis not present

## 2022-07-28 DIAGNOSIS — Z538 Procedure and treatment not carried out for other reasons: Secondary | ICD-10-CM | POA: Insufficient documentation

## 2022-07-28 SURGERY — CANCELLED PROCEDURE

## 2022-07-28 MED ORDER — SODIUM CHLORIDE 0.9 % IV SOLN: INTRAVENOUS | Status: DC

## 2022-07-28 NOTE — Progress Notes (Signed)
Called ekg to confirm that pt is in sinus rhythm

## 2022-07-28 NOTE — H&P (Addendum)
HISTORY AND PHYSICAL  Patient ID: Anne Lang MRN: [id number] DOB/AGE: [birth year] 71 y.o.  Admit date: 07/28/2022 Attending physician: Rex Kras, DO Primary Physician:  Faustino Congress, NP  Chief complaint: elective TEE   HPI:  Anne Lang is a 71 y.o. female who presents with a chief complaint of "elective TEE." Her past medical history and cardiovascular risk factors include: Hyperlipidemia, hypertension, diabetes melitis type II, sleep apnea, depression, persistent atrial fibrillation s/p DDCV (02/24/2022).  Underwent TEE guided cardioversion in April 2023 but reverted back to Afib. Since than medical therpay has been uptitrated and still remains symptomatic.   Shared decision was to proceed with cardioversion only.   ALLERGIES: Allergies  Allergen Reactions   Empagliflozin Other (See Comments)    Other reaction(s): Yeast infections, itching   Pioglitazone Other (See Comments)    dependent edema   Sulfa Antibiotics Other (See Comments)    Urinary tract infection (1977)    PAST MEDICAL HISTORY: Past Medical History:  Diagnosis Date   Arthritis    Atrial fibrillation ([place])    Diabetes mellitus without complication ([place])    Hemorrhoids    Hyperlipidemia    Hypertension    Hypothyroidism    Impingement syndrome of right shoulder     PAST SURGICAL HISTORY: Past Surgical History:  Procedure Laterality Date   BUBBLE STUDY  02/24/2022   Procedure: BUBBLE STUDY;  Surgeon: Rex Kras, DO;  Location: [place];  Service: Cardiovascular;;   CARDIOVERSION N/A 02/24/2022   Procedure: CARDIOVERSION;  Surgeon: Rex Kras, DO;  Location: [facility];  Service: Cardiovascular;  Laterality: N/A;  shocked @ 1323 '@200j'$  x1   colonscopy      ingrown toenail     1969    TEE WITHOUT CARDIOVERSION N/A 02/24/2022   Procedure: TRANSESOPHAGEAL ECHOCARDIOGRAM (TEE);  Surgeon: Rex Kras, DO;  Location: [facility];  Service: Cardiovascular;  Laterality: N/A;    TONSILLECTOMY     TOTAL KNEE ARTHROPLASTY Left 12/09/2014   Procedure: LEFT TOTAL KNEE ARTHROPLASTY;  Surgeon: [name], MD;  Location: [facility];  Service: Orthopedics;  Laterality: Left;   trauma to left thumb     with surgical repair    TUBAL LIGATION      FAMILY HISTORY: The patient family history includes Arthritis/Rheumatoid in her sister; Atrial fibrillation in her sister; Congestive Heart Failure in her mother; Emphysema in her mother; Heart attack in her brother; Heart attack (age of onset: 66) in her father; Hypertension in her brother; Other in her brother and sister.   SOCIAL HISTORY:  The patient  reports that she has never smoked. She has never used smokeless tobacco. She reports that she does not drink alcohol and does not use drugs.  MEDICATIONS: Current Outpatient Medications  Medication Instructions   acetaminophen (TYLENOL) 1,000 mg, Oral, 2 times daily   apixaban (ELIQUIS) 5 mg, Oral, 2 times daily   Ascorbic Acid (VITAMIN C PO) 1,000 mg, Oral, Daily   atorvastatin (LIPITOR) 5 mg, Oral, Daily   buPROPion (WELLBUTRIN XL) 150 mg, Oral, Every morning   Calcium Carbonate-Vit D-Min (CALCIUM 1200 PO) 600 mg, Oral, Daily   diltiazem (CARDIZEM CD) 360 mg, Oral, Every morning   docusate sodium (COLACE) 100 mg, Oral, Daily   Dulaglutide (TRULICITY [place]) 3 mg, Subcutaneous, Every Mon   ferrous sulfate 325 mg, Oral, 3 times weekly   flecainide (TAMBOCOR) 100 mg, Oral, 2 times daily   furosemide (LASIX) 10 mg, Oral, As needed, TAKE 1/2 TABLET    hydrochlorothiazide (  HYDRODIURIL) 25 mg, Oral, Every morning   levothyroxine (SYNTHROID) 150 mcg, Oral, Daily before breakfast   metFORMIN (GLUCOPHAGE) 1,000 mg, Oral, 2 times daily with meals   Multiple Vitamins-Minerals (CENTRUM SILVER PO) 1 tablet, Oral, Daily   ramipril (ALTACE) 10 mg, Oral, Every morning   TRULICITY 4.5 ST/4.1DQ SOPN Subcutaneous, Weekly   vitamin D3 (CHOLECALCIFEROL) 1,000 Units, Oral, Daily      REVIEW  OF SYSTEMS: Review of Systems  Cardiovascular:  Negative for chest pain, claudication, dyspnea on exertion, irregular heartbeat, leg swelling, near-syncope, orthopnea, palpitations, paroxysmal nocturnal dyspnea and syncope.  Respiratory:  Negative for shortness of breath.   Hematologic/Lymphatic: Negative for bleeding problem.  Musculoskeletal:  Negative for muscle cramps and myalgias.  Neurological:  Negative for dizziness and light-headedness.  All other systems reviewed and are negative.   PHYSICAL EXAM:    06/11/2022   12:02 PM 06/11/2022   11:54 AM 05/06/2022   11:28 AM  Vitals with BMI  Height  '5\' 5"'$  '5\' 5"'$   Weight  228 lbs 10 oz 229 lbs 13 oz  BMI  22.29 79.89  Systolic 211 941 740  Diastolic 82 81 73  Pulse 88 91 91    CONSTITUTIONAL: Age-appropriate female, hemodynamically stable,  No acute distress.  SKIN: Skin is warm and dry. No rash noted. No cyanosis. No pallor. No jaundice HEENT: Normocephalic, atraumatic, no xanthelasmas, no JVP, trachea midline, moist mucous membranes. CHEST Normal respiratory effort. No intercostal retractions  LUNGS: Clear to auscultation bilaterally.  No stridor. No wheezes. No rales.  CARDIOVASCULAR: Irregularly irregular, variable C1-K4, soft holosystolic murmur at the apex, no gallops or rubs.   ABDOMINAL: Obese, soft, nontender, nondistended, positive bowel sounds in all 4 quadrants, no apparent ascites.  EXTREMITIES: Trace bilateral peripheral edema, warm to touch, 2+ DP PT pulses. HEMATOLOGIC: No significant bruising NEUROLOGIC: Oriented to person, place, and time. Nonfocal. Normal muscle tone.  PSYCHIATRIC: Normal mood and affect. Normal behavior. Cooperative  No change since the last encounter.   RADIOLOGY: No results found.  LABORATORY DATA: Lab Results  Component Value Date   WBC 8.8 07/05/2022   HGB 13.7 07/05/2022   HCT 40.8 07/05/2022   MCV 86 07/05/2022   PLT 237 07/05/2022   No results for input(s): "NA", "K", "CL",  "CO2", "BUN", "CREATININE", "CALCIUM", "PROT", "BILITOT", "ALKPHOS", "ALT", "AST", "GLUCOSE" in the last 168 hours.  Invalid input(s): "LABALBU"  Lipid Panel  No results found for: "CHOL", "HDL", "LDLCALC", "LDLDIRECT", "TRIG", "CHOLHDL"  BNP (last 3 results) No results for input(s): "BNP" in the last 8760 hours.  HEMOGLOBIN A1C No results found for: "HGBA1C", "MPG"  Cardiac Panel (last 3 results) No results for input(s): "CKTOTAL", "CKMB", "RELINDX" in the last 8760 hours.  Invalid input(s): "TROPONINHS"  No results found for: "CKTOTAL", "CKMB", "CKMBINDEX"   TSH No results for input(s): "TSH" in the last 8760 hours.    CARDIAC DATABASE: DDCV 02/24/2022: 200J x 1 converted to NSR.    EKG: 02/01/2022: Atrial fibrillation, 74 bpm, normal axis, without underlying injury pattern. 02/24/2022: NSR, 84 bpm, without underlying ischemia or injury pattern. June 11, 2022: Atrial fibrillation, 95 bpm, normal axis, without underlying injury pattern.   Echocardiogram: 03/16/2022: Left ventricle cavity is normal in size.  Mild concentric remodeling of the left ventricle. Normal global wall motion. Normal LV systolic function with EF 55%. Unable to evaluate diastolic function due to atrial fibrillation.  Left atrial cavity is moderately dilated. Mild to moderate mitral regurgitation. Mild to moderate tricuspid regurgitation. Mild pulmonic  regurgitation. The IVC is not well visualized.    Stress Testing: Lexiscan (Modified Bruce Protocol) Tetrofosmin stress test 03/22/2022: 1 Day Rest/Stress protocol.  Stress EKG is non-diagnostic, due to baseline ST-T changes. Exercise time 4 minutes, achieved 2.28 METS, 135% APMHR. Normal myocardial perfusion without convincing evidence of reversible myocardial ischemia or prior infarct. Left ventricular size mildly dilated (EDV 122 cc), left ventricular wall thickness and wall motion preserved.  Calculated LVEF 63%. Low risk study.   Heart  Catheterization: None  IMPRESSION & RECOMMENDATIONS: Anne Lang is a 71 y.o. Caucasian female whose past medical history and cardiovascular risk factors include: Hyperlipidemia, hypertension, diabetes melitis type II, sleep apnea, depression, persistent atrial fibrillation s/p DDCV (02/24/2022)..  Impression:  Persistent Afib  Long term anticoagulants   Plan:  Due to symptomatic Afib despite uptitration of AV nodal agents and AAD medications the shared decision is to proceed with cardioversion only. No breaks / stops in [facility] over the last 4 weeks.   Risks, benefits, and alternatives of direct current cardioversion reviewed with the and patient. Risk includes but not limited to: potential for post-cardioversion rhythms, life-threatening arrhythmias (ventricular tachycardia and fibrillation, profound bradycardia, cardiac arrest), myocardial damage, acute pulmonary edema, skin burns, transient hypotension. Benefits include restoration of sinus rhythm. Alternatives to treatment were discussed, questions were answered, patient voices understanding and provides verbal feedback.  Patient is willing to proceed.   Patient's questions and concerns were addressed to her satisfaction. She voices understanding of the instructions provided during this encounter.   This note was created using a voice recognition software as a result there may be grammatical errors inadvertently enclosed that do not reflect the nature of this encounter. Every attempt is made to correct such errors.  Mec[facility]  Pager: [phone] Office: [phone] 07/28/2022, 12:46 PM  ADDENDUM The procedure was canceled as the patient's preprocedural EKG illustrates normal sinus rhythm.  Patient was informed of these findings.  Continue current medical therapy.    Rex Kras, [place], [facility]  Pager: [phone] Office: [phone]

## 2022-07-30 ENCOUNTER — Ambulatory Visit: Payer: Federal, State, Local not specified - PPO | Admitting: Cardiology

## 2022-08-06 ENCOUNTER — Ambulatory Visit: Payer: Federal, State, Local not specified - PPO | Admitting: Cardiology

## 2022-08-29 ENCOUNTER — Other Ambulatory Visit: Payer: Self-pay | Admitting: Cardiology

## 2022-08-29 DIAGNOSIS — Z7901 Long term (current) use of anticoagulants: Secondary | ICD-10-CM

## 2022-08-29 DIAGNOSIS — Z79899 Other long term (current) drug therapy: Secondary | ICD-10-CM

## 2022-08-29 DIAGNOSIS — I4819 Other persistent atrial fibrillation: Secondary | ICD-10-CM

## 2022-08-30 DIAGNOSIS — L821 Other seborrheic keratosis: Secondary | ICD-10-CM | POA: Diagnosis not present

## 2022-08-30 DIAGNOSIS — D0471 Carcinoma in situ of skin of right lower limb, including hip: Secondary | ICD-10-CM | POA: Diagnosis not present

## 2022-08-30 DIAGNOSIS — L853 Xerosis cutis: Secondary | ICD-10-CM | POA: Diagnosis not present

## 2022-08-30 DIAGNOSIS — D485 Neoplasm of uncertain behavior of skin: Secondary | ICD-10-CM | POA: Diagnosis not present

## 2022-08-30 DIAGNOSIS — L219 Seborrheic dermatitis, unspecified: Secondary | ICD-10-CM | POA: Diagnosis not present

## 2022-09-01 ENCOUNTER — Telehealth: Payer: Self-pay

## 2022-09-03 DIAGNOSIS — D0472 Carcinoma in situ of skin of left lower limb, including hip: Secondary | ICD-10-CM | POA: Diagnosis not present

## 2022-09-03 DIAGNOSIS — Z6837 Body mass index (BMI) 37.0-37.9, adult: Secondary | ICD-10-CM | POA: Diagnosis not present

## 2022-09-13 DIAGNOSIS — C44722 Squamous cell carcinoma of skin of right lower limb, including hip: Secondary | ICD-10-CM | POA: Diagnosis not present

## 2022-09-13 HISTORY — PX: SQUAMOUS CELL CARCINOMA EXCISION: SHX2433

## 2022-10-08 DIAGNOSIS — Z7901 Long term (current) use of anticoagulants: Secondary | ICD-10-CM | POA: Diagnosis not present

## 2022-10-08 DIAGNOSIS — E7800 Pure hypercholesterolemia, unspecified: Secondary | ICD-10-CM | POA: Diagnosis not present

## 2022-10-08 DIAGNOSIS — I10 Essential (primary) hypertension: Secondary | ICD-10-CM | POA: Diagnosis not present

## 2022-10-08 DIAGNOSIS — Z79899 Other long term (current) drug therapy: Secondary | ICD-10-CM | POA: Diagnosis not present

## 2022-10-08 DIAGNOSIS — E1165 Type 2 diabetes mellitus with hyperglycemia: Secondary | ICD-10-CM | POA: Diagnosis not present

## 2022-10-08 DIAGNOSIS — E039 Hypothyroidism, unspecified: Secondary | ICD-10-CM | POA: Diagnosis not present

## 2022-10-08 DIAGNOSIS — D099 Carcinoma in situ, unspecified: Secondary | ICD-10-CM | POA: Diagnosis not present

## 2022-10-08 DIAGNOSIS — G4733 Obstructive sleep apnea (adult) (pediatric): Secondary | ICD-10-CM | POA: Diagnosis not present

## 2022-10-08 DIAGNOSIS — Z6837 Body mass index (BMI) 37.0-37.9, adult: Secondary | ICD-10-CM | POA: Diagnosis not present

## 2022-10-08 DIAGNOSIS — I4891 Unspecified atrial fibrillation: Secondary | ICD-10-CM | POA: Diagnosis not present

## 2022-10-08 DIAGNOSIS — F331 Major depressive disorder, recurrent, moderate: Secondary | ICD-10-CM | POA: Diagnosis not present

## 2022-10-08 DIAGNOSIS — I499 Cardiac arrhythmia, unspecified: Secondary | ICD-10-CM | POA: Diagnosis not present

## 2022-10-12 ENCOUNTER — Ambulatory Visit: Payer: Medicare Other | Admitting: Cardiology

## 2022-10-12 ENCOUNTER — Encounter: Payer: Self-pay | Admitting: Cardiology

## 2022-10-12 VITALS — BP 143/73 | HR 70 | Resp 16 | Ht 65.0 in | Wt 233.0 lb

## 2022-10-12 DIAGNOSIS — I480 Paroxysmal atrial fibrillation: Secondary | ICD-10-CM

## 2022-10-12 DIAGNOSIS — Z7901 Long term (current) use of anticoagulants: Secondary | ICD-10-CM

## 2022-10-12 DIAGNOSIS — E119 Type 2 diabetes mellitus without complications: Secondary | ICD-10-CM | POA: Diagnosis not present

## 2022-10-12 DIAGNOSIS — G4730 Sleep apnea, unspecified: Secondary | ICD-10-CM

## 2022-10-12 DIAGNOSIS — I10 Essential (primary) hypertension: Secondary | ICD-10-CM | POA: Diagnosis not present

## 2022-10-12 DIAGNOSIS — Z79899 Other long term (current) drug therapy: Secondary | ICD-10-CM | POA: Diagnosis not present

## 2022-10-12 NOTE — Progress Notes (Signed)
Date:  10/12/2022   ID:  [name], DOB 1951-04-27, MRN [id number]  PCP:  Faustino Congress, NP  Cardiologist:  Rex Kras, DO, [facility] (established care 02/01/2022)  Date: 10/12/22 Last Office Visit: 06/29/2022  Chief Complaint  Patient presents with   Follow-up    Routine follow-up for paroxysmal atrial fibrillation    HPI  Anne Lang is a 71 y.o. Caucasian female whose past medical history and cardiovascular risk factors include: Hyperlipidemia, hypertension, diabetes melitis type II, sleep apnea, depression, persistent atrial fibrillation s/p DDCV (02/24/2022).  December 2023 prior to her cataract surgery she was noted to be in atrial flutter; however, did not seek medical attention until March 2023.  She underwent TEE guided cardioversion and normal sinus rhythm was restored with 200 J x 1.  However, at her 2-week follow-up visit after the cardioversion she was back in A-fib.  Since then her AV nodal blocking medications have been uptitrated, and started on flecainide and uptitrated to 100 mg p.o. twice daily, she has been evaluated for sleep apnea now on CPAP.  Of note metoprolol was transitioned to diltiazem due to depressive thoughts.  At the last office visit she was scheduled for direct-current cardioversion;, on the day of the procedure EKG was performed and she was in sinus rhythm and the procedure was aborted.  She now presents for follow-up.  She is doing well from a cardiovascular standpoint.  Her home blood pressures are very well-controlled a log updated in the media section.  She does not endorse evidence of bleeding.  She remains in sinus rhythm for majority of the time.  FUNCTIONAL STATUS: Swimming three days a week at [facility] and bowling.    ALLERGIES: Allergies  Allergen Reactions   Empagliflozin Other (See Comments)    Other reaction(s): Yeast infections, itching   Pioglitazone Other (See Comments)    dependent edema   Sulfa Antibiotics Other  (See Comments)    Urinary tract infection (1977)    MEDICATION LIST PRIOR TO VISIT: Current Meds  Medication Sig   acetaminophen (TYLENOL) 500 MG tablet Take 1,000 mg by mouth 2 (two) times daily.   Ascorbic Acid (VITAMIN C PO) Take 1,000 mg by mouth daily.   atorvastatin (LIPITOR) 10 MG tablet Take 5 mg by mouth daily.   buPROPion (WELLBUTRIN XL) 150 MG 24 hr tablet Take 150 mg by mouth every morning.   Calcium Carbonate-Vit D-Min (CALCIUM 1200 PO) Take 600 mg by mouth daily.   diltiazem (CARDIZEM CD) 360 MG 24 hr capsule TAKE 1 CAPSULE EVERY       MORNING   docusate sodium (COLACE) 100 MG capsule Take 100 mg by mouth daily.   ELIQUIS 5 MG TABS tablet TAKE 1 TABLET TWICE A DAY   ferrous sulfate 325 (65 FE) MG tablet Take 325 mg by mouth 3 (three) times a week.   flecainide (TAMBOCOR) 100 MG tablet TAKE 1 TABLET TWICE A DAY   furosemide (LASIX) 20 MG tablet Take 10 mg by mouth as needed for fluid. TAKE 1/2 TABLET   hydrochlorothiazide (HYDRODIURIL) 25 MG tablet Take 25 mg by mouth every morning.   levothyroxine (SYNTHROID, LEVOTHROID) 150 MCG tablet Take 150 mcg by mouth daily before breakfast.   metFORMIN (GLUCOPHAGE) 1000 MG tablet Take 1,000 mg by mouth 2 (two) times daily with a meal.   Multiple Vitamins-Minerals (CENTRUM SILVER PO) Take 1 tablet by mouth daily.   ramipril (ALTACE) 10 MG capsule Take 10 mg by mouth every morning.  TRULICITY 4.5 ZO/1.0RU SOPN Inject into the skin once a week.   Vitamin D3 (VITAMIN D) 25 MCG tablet Take 1,000 Units by mouth daily.     PAST MEDICAL HISTORY: Past Medical History:  Diagnosis Date   Arthritis    Atrial fibrillation ([place])    Diabetes mellitus without complication ([place])    Hemorrhoids    Hyperlipidemia    Hypertension    Hypothyroidism    Impingement syndrome of right shoulder     PAST SURGICAL HISTORY: Past Surgical History:  Procedure Laterality Date   BUBBLE STUDY  02/24/2022   Procedure: BUBBLE STUDY;  Surgeon: Rex Kras, DO;  Location: [facility];  Service: Cardiovascular;;   CARDIOVERSION N/A 02/24/2022   Procedure: CARDIOVERSION;  Surgeon: Rex Kras, DO;  Location: [facility];  Service: Cardiovascular;  Laterality: N/A;  shocked @ 1323 '@200j'$  x1   colonscopy      ingrown toenail     1969    SQUAMOUS CELL CARCINOMA EXCISION Right 09/13/2022   TEE WITHOUT CARDIOVERSION N/A 02/24/2022   Procedure: TRANSESOPHAGEAL ECHOCARDIOGRAM (TEE);  Surgeon: Rex Kras, DO;  Location: [facility];  Service: Cardiovascular;  Laterality: N/A;   TONSILLECTOMY     TOTAL KNEE ARTHROPLASTY Left 12/09/2014   Procedure: LEFT TOTAL KNEE ARTHROPLASTY;  Surgeon: [name], MD;  Location: [facility];  Service: Orthopedics;  Laterality: Left;   trauma to left thumb     with surgical repair    TUBAL LIGATION      FAMILY HISTORY: The patient family history includes Arthritis/Rheumatoid in her sister; Atrial fibrillation in her sister; Congestive Heart Failure in her mother; Emphysema in her mother; Heart attack in her brother; Heart attack (age of onset: 42) in her father; Hypertension in her brother; Other in her brother and sister.  SOCIAL HISTORY:  The patient  reports that she has never smoked. She has never used smokeless tobacco. She reports that she does not drink alcohol and does not use drugs.  REVIEW OF SYSTEMS: Review of Systems  Constitutional: Negative for malaise/fatigue.  Cardiovascular:  Negative for chest pain, dyspnea on exertion, leg swelling, palpitations and syncope.  Respiratory:  Negative for shortness of breath.     PHYSICAL EXAM:    10/12/2022   10:28 AM 06/11/2022   12:02 PM 06/11/2022   11:54 AM  Vitals with BMI  Height '5\' 5"'$   '5\' 5"'$   Weight 233 lbs  228 lbs 10 oz  BMI 04.54  09.81  Systolic 191 478 295  Diastolic 73 82 81  Pulse 70 88 91   CONSTITUTIONAL: Age-appropriate female, hemodynamically stable,  No acute distress.  SKIN: Skin is warm and dry. No rash noted. No cyanosis.  No pallor. No jaundice HEENT: Normocephalic, atraumatic, no xanthelasmas, no JVP, trachea midline, moist mucous membranes. CHEST Normal respiratory effort. No intercostal retractions  LUNGS: Clear to auscultation bilaterally.  No stridor. No wheezes. No rales.  CARDIOVASCULAR: Irregularly irregular, variable A2-Z3, soft holosystolic murmur at the apex, no gallops or rubs.   ABDOMINAL: Obese, soft, nontender, nondistended, positive bowel sounds in all 4 quadrants, no apparent ascites.  EXTREMITIES: +1 peripheral edema RLE (around the site of injury), warm to touch, 2+ DP PT pulses. HEMATOLOGIC: No significant bruising NEUROLOGIC: Oriented to person, place, and time. Nonfocal. Normal muscle tone.  PSYCHIATRIC: Normal mood and affect. Normal behavior. Cooperative  CARDIAC DATABASE: DDCV 02/24/2022: 200J x 1 converted to NSR.   EKG: 02/01/2022: Atrial fibrillation, 74 bpm, normal axis, without underlying injury pattern. 02/24/2022:  NSR, 84 bpm, without underlying ischemia or injury pattern. June 11, 2022: Atrial fibrillation, 95 bpm, normal axis, without underlying injury pattern. 10/12/2022: Normal sinus rhythm, 64 bpm, without underlying ischemia or injury pattern.  Echocardiogram: 03/16/2022: Left ventricle cavity is normal in size.  Mild concentric remodeling of the left ventricle. Normal global wall motion. Normal LV systolic function with EF 55%. Unable to evaluate diastolic function due to atrial fibrillation.  Left atrial cavity is moderately dilated. Mild to moderate mitral regurgitation. Mild to moderate tricuspid regurgitation. Mild pulmonic regurgitation. The IVC is not well visualized.   Stress Testing: Lexiscan (Modified Bruce Protocol) Tetrofosmin stress test 03/22/2022: 1 Day Rest/Stress protocol.  Stress EKG is non-diagnostic, due to baseline ST-T changes. Exercise time 4 minutes, achieved 2.28 METS, 135% APMHR. Normal myocardial perfusion without convincing evidence of  reversible myocardial ischemia or prior infarct. Left ventricular size mildly dilated (EDV 122 cc), left ventricular wall thickness and wall motion preserved.  Calculated LVEF 63%. Low risk study.  Heart Catheterization: None  LABORATORY DATA:    Latest Ref Rng & Units 07/05/2022    2:37 PM 02/09/2022    9:49 AM 12/11/2014    5:18 AM  CBC  WBC 3.4 - 10.8 x10E3/uL 8.8   10.7   Hemoglobin 11.1 - 15.9 g/dL 13.7  13.8  10.9   Hematocrit 34.0 - 46.6 % 40.8  41.2  35.4   Platelets 150 - 450 x10E3/uL 237   175        Latest Ref Rng & Units 07/05/2022    2:37 PM 02/09/2022    9:48 AM 12/11/2014    5:18 AM  CMP  Glucose 70 - 99 mg/dL 117  141  167   BUN 8 - 27 mg/dL '13  14  16   '$ Creatinine 0.57 - 1.00 mg/dL 0.80  0.80  0.73   Sodium 134 - 144 mmol/L 138  137  138   Potassium 3.5 - 5.2 mmol/L 4.0  4.3  3.7   Chloride 96 - 106 mmol/L 97  94  104   CO2 20 - 29 mmol/L '27  24  28   '$ Calcium 8.7 - 10.3 mg/dL 10.0  9.9  9.0   Total Protein 6.0 - 8.5 g/dL 7.0     Total Bilirubin 0.0 - 1.2 mg/dL 0.2     Alkaline Phos 44 - 121 IU/L 93     AST 0 - 40 IU/L 17     ALT 0 - 32 IU/L 16       Lipid Panel  No results found for: "CHOL", "TRIG", "HDL", "CHOLHDL", "VLDL", "LDLCALC", "LDLDIRECT", "LABVLDL"  No components found for: "NTPROBNP" No results for input(s): "PROBNP" in the last 8760 hours. No results for input(s): "TSH" in the last 8760 hours.  BMP Recent Labs    02/09/22 0948 07/05/22 1437  NA 137 138  K 4.3 4.0  CL 94* 97  CO2 24 27  GLUCOSE 141* 117*  BUN 14 13  CREATININE 0.80 0.80  CALCIUM 9.9 10.0    HEMOGLOBIN A1C No results found for: "HGBA1C", "MPG"  External Labs: Collected: 01/25/2022. Hemoglobin 14.4 g/dL, hematocrit 43.9%. BUN 14, creatinine 0.82 mg/dL. Sodium 141, potassium 4, chloride 102, bicarb 31. AST 21, ALT 22, alkaline phosphatase 69 Hemoglobin A1c 8.4. Total cholesterol 161, triglycerides 132, HDL 59, LDL 79, non-HDL 102. TSH 0.94   IMPRESSION:     ICD-10-CM   1. Paroxysmal atrial fibrillation (HCC)  I48.0 EKG 12-Lead    Hemoglobin and hematocrit,  blood    Basic metabolic panel    2. Long term (current) use of anticoagulants  Z79.01 Hemoglobin and hematocrit, blood    Basic metabolic panel    3. Long term current use of antiarrhythmic drug  Z79.899 Hemoglobin and hematocrit, blood    Basic metabolic panel    4. Sleep apnea, unspecified type  G47.30     5. Benign hypertension  I10     6. Non-insulin dependent type 2 diabetes mellitus ([place])  E11.9        RECOMMENDATIONS: Anne Lang is a 71 y.o. Caucasian female whose past medical history and cardiac risk factors include: Hyperlipidemia, hypertension, diabetes melitis type II, sleep apnea, depression, persistent atrial fibrillation s/p DDCV (02/24/2022).  Paroxysmal atrial fibrillation (HCC) Rate control: Diltiazem. Rhythm control: Flecainide. Thromboembolic prophylaxis: Eliquis CHA2DS2-VASc SCORE is 4 which correlates to 4% risk of stroke per year (age, gender, DM, HTN).  Underwent TEE guided cardioversion and converted to NSR after 200 J x 1 but reverted back to A-fib by her 2-week follow-up visit. The dose of diltiazem was uptitrated and eventually started on flecainide as well. She reverted back to normal sinus rhythm and has maintained sinus rhythm on current medical therapy.  Long term (current) use of anticoagulants Indication: Paroxysmal atrial fibrillation. Currently on Eliquis. Does not endorse evidence of bleeding. Discussed the risks, benefits, and alternatives to oral anticoagulation. Samples for Eliquis provided. Patient will have labs in March 2024 as part of her physical-I have asked her to send [place] a copy so that we can trend her hemoglobin and creatinine. Will order labs in September 2024 prior to the office visit to recheck hemoglobin and creatinine at the 33-monthfollow-up  Non-insulin dependent type 2 diabetes mellitus ([place] Educated the  importance of glycemic control.  Benign hypertension Home blood pressures are within acceptable range. See media section Medications reconciled. Currently managed by primary care provider. We will follow peripherally.  Physical examination patient is noted to have right lower extremity swelling compared to the left lower extremity.  She also has an open wound on the right lower extremity which may be contributing to underlying inflammation.  In addition, patient states that the swelling gets better in the morning.  She is also on oral anticoagulation so the overall probability of DVT is low.  However, if the swelling continues to be progressive I have asked her to have it reevaluated by PCP and consider lower extremity venous duplex.  FINAL MEDICATION LIST END OF ENCOUNTER: No orders of the defined types were placed in this encounter.   Medications Discontinued During This Encounter  Medication Reason   Dulaglutide (TRULICITY [place])      Current Outpatient Medications:    acetaminophen (TYLENOL) 500 MG tablet, Take 1,000 mg by mouth 2 (two) times daily., Disp: , Rfl:    Ascorbic Acid (VITAMIN C PO), Take 1,000 mg by mouth daily., Disp: , Rfl:    atorvastatin (LIPITOR) 10 MG tablet, Take 5 mg by mouth daily., Disp: , Rfl:    buPROPion (WELLBUTRIN XL) 150 MG 24 hr tablet, Take 150 mg by mouth every morning., Disp: , Rfl:    Calcium Carbonate-Vit D-Min (CALCIUM 1200 PO), Take 600 mg by mouth daily., Disp: , Rfl:    diltiazem (CARDIZEM CD) 360 MG 24 hr capsule, TAKE 1 CAPSULE EVERY       MORNING, Disp: 90 capsule, Rfl: 0   docusate sodium (COLACE) 100 MG capsule, Take 100 mg by mouth daily., Disp: , Rfl:  ELIQUIS 5 MG TABS tablet, TAKE 1 TABLET TWICE A DAY, Disp: 180 tablet, Rfl: 0   ferrous sulfate 325 (65 FE) MG tablet, Take 325 mg by mouth 3 (three) times a week., Disp: , Rfl:    flecainide (TAMBOCOR) 100 MG tablet, TAKE 1 TABLET TWICE A DAY, Disp: 180 tablet, Rfl: 0   furosemide (LASIX)  20 MG tablet, Take 10 mg by mouth as needed for fluid. TAKE 1/2 TABLET, Disp: , Rfl:    hydrochlorothiazide (HYDRODIURIL) 25 MG tablet, Take 25 mg by mouth every morning., Disp: , Rfl: 0   levothyroxine (SYNTHROID, LEVOTHROID) 150 MCG tablet, Take 150 mcg by mouth daily before breakfast., Disp: , Rfl:    metFORMIN (GLUCOPHAGE) 1000 MG tablet, Take 1,000 mg by mouth 2 (two) times daily with a meal., Disp: , Rfl:    Multiple Vitamins-Minerals (CENTRUM SILVER PO), Take 1 tablet by mouth daily., Disp: , Rfl:    ramipril (ALTACE) 10 MG capsule, Take 10 mg by mouth every morning., Disp: , Rfl:    TRULICITY 4.5 QQ/7.6PP SOPN, Inject into the skin once a week., Disp: , Rfl:    Vitamin D3 (VITAMIN D) 25 MCG tablet, Take 1,000 Units by mouth daily., Disp: , Rfl:   Orders Placed This Encounter  Procedures   Hemoglobin and hematocrit, blood   Basic metabolic panel   EKG 50-DTOI    There are no Patient Instructions on file for this visit.   --Continue cardiac medications as reconciled in final medication list. --Return in about 9 months (around 07/18/2023) for Follow up, A. fib. Or sooner if needed. --Continue follow-up with your primary care physician regarding the management of your other chronic comorbid conditions.  Patient's questions and concerns were addressed to her satisfaction. She voices understanding of the instructions provided during this encounter.   This note was created using a voice recognition software as a result there may be grammatical errors inadvertently enclosed that do not reflect the nature of this encounter. Every attempt is made to correct such errors.  Rex Kras, [place], [facility]  Pager: [phone] Office: [phone]

## 2022-10-13 DIAGNOSIS — Z5189 Encounter for other specified aftercare: Secondary | ICD-10-CM | POA: Diagnosis not present

## 2022-10-21 DIAGNOSIS — I10 Essential (primary) hypertension: Secondary | ICD-10-CM | POA: Diagnosis not present

## 2022-10-21 DIAGNOSIS — E669 Obesity, unspecified: Secondary | ICD-10-CM | POA: Diagnosis not present

## 2022-10-21 DIAGNOSIS — E1169 Type 2 diabetes mellitus with other specified complication: Secondary | ICD-10-CM | POA: Diagnosis not present

## 2022-10-21 DIAGNOSIS — G4733 Obstructive sleep apnea (adult) (pediatric): Secondary | ICD-10-CM | POA: Diagnosis not present

## 2022-10-21 DIAGNOSIS — E039 Hypothyroidism, unspecified: Secondary | ICD-10-CM | POA: Diagnosis not present

## 2022-11-15 DIAGNOSIS — Z8589 Personal history of malignant neoplasm of other organs and systems: Secondary | ICD-10-CM | POA: Diagnosis not present

## 2022-11-15 DIAGNOSIS — Z5189 Encounter for other specified aftercare: Secondary | ICD-10-CM | POA: Diagnosis not present

## 2022-11-24 ENCOUNTER — Other Ambulatory Visit: Payer: Self-pay | Admitting: Family Medicine

## 2022-11-24 ENCOUNTER — Other Ambulatory Visit: Payer: Self-pay | Admitting: Cardiology

## 2022-11-24 DIAGNOSIS — Z1231 Encounter for screening mammogram for malignant neoplasm of breast: Secondary | ICD-10-CM

## 2022-11-24 DIAGNOSIS — I4819 Other persistent atrial fibrillation: Secondary | ICD-10-CM

## 2022-12-06 ENCOUNTER — Other Ambulatory Visit: Payer: Self-pay | Admitting: Cardiology

## 2022-12-06 DIAGNOSIS — I4819 Other persistent atrial fibrillation: Secondary | ICD-10-CM

## 2022-12-06 DIAGNOSIS — Z79899 Other long term (current) drug therapy: Secondary | ICD-10-CM

## 2022-12-16 DIAGNOSIS — Z8589 Personal history of malignant neoplasm of other organs and systems: Secondary | ICD-10-CM | POA: Diagnosis not present

## 2022-12-16 DIAGNOSIS — E1151 Type 2 diabetes mellitus with diabetic peripheral angiopathy without gangrene: Secondary | ICD-10-CM | POA: Diagnosis not present

## 2022-12-16 DIAGNOSIS — Z5189 Encounter for other specified aftercare: Secondary | ICD-10-CM | POA: Diagnosis not present

## 2022-12-16 DIAGNOSIS — E1142 Type 2 diabetes mellitus with diabetic polyneuropathy: Secondary | ICD-10-CM | POA: Diagnosis not present

## 2022-12-16 DIAGNOSIS — L603 Nail dystrophy: Secondary | ICD-10-CM | POA: Diagnosis not present

## 2022-12-16 DIAGNOSIS — I739 Peripheral vascular disease, unspecified: Secondary | ICD-10-CM | POA: Diagnosis not present

## 2023-01-02 ENCOUNTER — Other Ambulatory Visit: Payer: Self-pay | Admitting: Cardiology

## 2023-01-02 DIAGNOSIS — Z7901 Long term (current) use of anticoagulants: Secondary | ICD-10-CM

## 2023-01-13 ENCOUNTER — Ambulatory Visit
Admission: RE | Admit: 2023-01-13 | Discharge: 2023-01-13 | Disposition: A | Discharge status: Home / Self Care | Payer: Medicare Other | Source: Ambulatory Visit | Attending: Family Medicine | Admitting: Family Medicine

## 2023-01-13 DIAGNOSIS — Z1231 Encounter for screening mammogram for malignant neoplasm of breast: Secondary | ICD-10-CM

## 2023-02-03 DIAGNOSIS — E039 Hypothyroidism, unspecified: Secondary | ICD-10-CM | POA: Diagnosis not present

## 2023-02-03 DIAGNOSIS — E7800 Pure hypercholesterolemia, unspecified: Secondary | ICD-10-CM | POA: Diagnosis not present

## 2023-02-03 DIAGNOSIS — Z79899 Other long term (current) drug therapy: Secondary | ICD-10-CM | POA: Diagnosis not present

## 2023-02-03 DIAGNOSIS — Z7901 Long term (current) use of anticoagulants: Secondary | ICD-10-CM | POA: Diagnosis not present

## 2023-02-03 DIAGNOSIS — Z0000 Encounter for general adult medical examination without abnormal findings: Secondary | ICD-10-CM | POA: Diagnosis not present

## 2023-02-03 DIAGNOSIS — Z6837 Body mass index (BMI) 37.0-37.9, adult: Secondary | ICD-10-CM | POA: Diagnosis not present

## 2023-02-03 DIAGNOSIS — F331 Major depressive disorder, recurrent, moderate: Secondary | ICD-10-CM | POA: Diagnosis not present

## 2023-02-03 DIAGNOSIS — I4891 Unspecified atrial fibrillation: Secondary | ICD-10-CM | POA: Diagnosis not present

## 2023-02-03 DIAGNOSIS — G4733 Obstructive sleep apnea (adult) (pediatric): Secondary | ICD-10-CM | POA: Diagnosis not present

## 2023-02-03 DIAGNOSIS — I10 Essential (primary) hypertension: Secondary | ICD-10-CM | POA: Diagnosis not present

## 2023-02-03 DIAGNOSIS — E1165 Type 2 diabetes mellitus with hyperglycemia: Secondary | ICD-10-CM | POA: Diagnosis not present

## 2023-02-17 DIAGNOSIS — Z780 Asymptomatic menopausal state: Secondary | ICD-10-CM | POA: Diagnosis not present

## 2023-02-17 DIAGNOSIS — E1165 Type 2 diabetes mellitus with hyperglycemia: Secondary | ICD-10-CM | POA: Diagnosis not present

## 2023-02-17 DIAGNOSIS — Z6837 Body mass index (BMI) 37.0-37.9, adult: Secondary | ICD-10-CM | POA: Diagnosis not present

## 2023-02-17 DIAGNOSIS — M25571 Pain in right ankle and joints of right foot: Secondary | ICD-10-CM | POA: Diagnosis not present

## 2023-02-18 ENCOUNTER — Other Ambulatory Visit: Payer: Self-pay | Admitting: Cardiology

## 2023-02-18 DIAGNOSIS — Z79899 Other long term (current) drug therapy: Secondary | ICD-10-CM

## 2023-02-18 DIAGNOSIS — I4819 Other persistent atrial fibrillation: Secondary | ICD-10-CM

## 2023-02-18 NOTE — Telephone Encounter (Signed)
Refill request

## 2023-02-21 ENCOUNTER — Other Ambulatory Visit: Payer: Self-pay | Admitting: Family Medicine

## 2023-02-21 DIAGNOSIS — Z780 Asymptomatic menopausal state: Secondary | ICD-10-CM

## 2023-03-01 DIAGNOSIS — M65312 Trigger thumb, left thumb: Secondary | ICD-10-CM | POA: Diagnosis not present

## 2023-03-01 DIAGNOSIS — M25571 Pain in right ankle and joints of right foot: Secondary | ICD-10-CM | POA: Diagnosis not present

## 2023-03-15 DIAGNOSIS — D225 Melanocytic nevi of trunk: Secondary | ICD-10-CM | POA: Diagnosis not present

## 2023-03-15 DIAGNOSIS — L814 Other melanin hyperpigmentation: Secondary | ICD-10-CM | POA: Diagnosis not present

## 2023-03-15 DIAGNOSIS — L820 Inflamed seborrheic keratosis: Secondary | ICD-10-CM | POA: Diagnosis not present

## 2023-03-15 DIAGNOSIS — D2371 Other benign neoplasm of skin of right lower limb, including hip: Secondary | ICD-10-CM | POA: Diagnosis not present

## 2023-03-15 DIAGNOSIS — D485 Neoplasm of uncertain behavior of skin: Secondary | ICD-10-CM | POA: Diagnosis not present

## 2023-03-15 DIAGNOSIS — L578 Other skin changes due to chronic exposure to nonionizing radiation: Secondary | ICD-10-CM | POA: Diagnosis not present

## 2023-03-15 DIAGNOSIS — L821 Other seborrheic keratosis: Secondary | ICD-10-CM | POA: Diagnosis not present

## 2023-03-15 DIAGNOSIS — L738 Other specified follicular disorders: Secondary | ICD-10-CM | POA: Diagnosis not present

## 2023-03-15 DIAGNOSIS — D099 Carcinoma in situ, unspecified: Secondary | ICD-10-CM | POA: Diagnosis not present

## 2023-03-17 DIAGNOSIS — E1142 Type 2 diabetes mellitus with diabetic polyneuropathy: Secondary | ICD-10-CM | POA: Diagnosis not present

## 2023-03-17 DIAGNOSIS — E1151 Type 2 diabetes mellitus with diabetic peripheral angiopathy without gangrene: Secondary | ICD-10-CM | POA: Diagnosis not present

## 2023-03-17 DIAGNOSIS — L603 Nail dystrophy: Secondary | ICD-10-CM | POA: Diagnosis not present

## 2023-03-17 DIAGNOSIS — I739 Peripheral vascular disease, unspecified: Secondary | ICD-10-CM | POA: Diagnosis not present

## 2023-03-22 DIAGNOSIS — M25571 Pain in right ankle and joints of right foot: Secondary | ICD-10-CM | POA: Diagnosis not present

## 2023-03-30 DIAGNOSIS — E1165 Type 2 diabetes mellitus with hyperglycemia: Secondary | ICD-10-CM | POA: Diagnosis not present

## 2023-04-06 DIAGNOSIS — K5903 Drug induced constipation: Secondary | ICD-10-CM | POA: Diagnosis not present

## 2023-04-06 DIAGNOSIS — Z6836 Body mass index (BMI) 36.0-36.9, adult: Secondary | ICD-10-CM | POA: Diagnosis not present

## 2023-04-06 DIAGNOSIS — M545 Low back pain, unspecified: Secondary | ICD-10-CM | POA: Diagnosis not present

## 2023-04-07 DIAGNOSIS — M5451 Vertebrogenic low back pain: Secondary | ICD-10-CM | POA: Diagnosis not present

## 2023-04-11 ENCOUNTER — Ambulatory Visit
Admission: RE | Admit: 2023-04-11 | Discharge: 2023-04-11 | Disposition: A | Discharge status: Home / Self Care | Payer: Medicare Other | Source: Ambulatory Visit | Attending: Family Medicine | Admitting: Family Medicine

## 2023-04-11 DIAGNOSIS — Z780 Asymptomatic menopausal state: Secondary | ICD-10-CM

## 2023-04-11 DIAGNOSIS — N958 Other specified menopausal and perimenopausal disorders: Secondary | ICD-10-CM | POA: Diagnosis not present

## 2023-04-11 DIAGNOSIS — E349 Endocrine disorder, unspecified: Secondary | ICD-10-CM | POA: Diagnosis not present

## 2023-04-12 DIAGNOSIS — M5451 Vertebrogenic low back pain: Secondary | ICD-10-CM | POA: Diagnosis not present

## 2023-04-14 DIAGNOSIS — M5451 Vertebrogenic low back pain: Secondary | ICD-10-CM | POA: Diagnosis not present

## 2023-04-16 DIAGNOSIS — R21 Rash and other nonspecific skin eruption: Secondary | ICD-10-CM | POA: Diagnosis not present

## 2023-04-19 DIAGNOSIS — M5451 Vertebrogenic low back pain: Secondary | ICD-10-CM | POA: Diagnosis not present

## 2023-04-21 DIAGNOSIS — M5451 Vertebrogenic low back pain: Secondary | ICD-10-CM | POA: Diagnosis not present

## 2023-04-28 DIAGNOSIS — E1165 Type 2 diabetes mellitus with hyperglycemia: Secondary | ICD-10-CM | POA: Diagnosis not present

## 2023-04-28 DIAGNOSIS — I4891 Unspecified atrial fibrillation: Secondary | ICD-10-CM | POA: Diagnosis not present

## 2023-04-29 DIAGNOSIS — M5451 Vertebrogenic low back pain: Secondary | ICD-10-CM | POA: Diagnosis not present

## 2023-05-04 DIAGNOSIS — M5451 Vertebrogenic low back pain: Secondary | ICD-10-CM | POA: Diagnosis not present

## 2023-05-16 DIAGNOSIS — R3915 Urgency of urination: Secondary | ICD-10-CM | POA: Diagnosis not present

## 2023-05-16 DIAGNOSIS — Z6836 Body mass index (BMI) 36.0-36.9, adult: Secondary | ICD-10-CM | POA: Diagnosis not present

## 2023-05-16 DIAGNOSIS — M25552 Pain in left hip: Secondary | ICD-10-CM | POA: Diagnosis not present

## 2023-05-17 DIAGNOSIS — M5451 Vertebrogenic low back pain: Secondary | ICD-10-CM | POA: Diagnosis not present

## 2023-05-20 DIAGNOSIS — M5451 Vertebrogenic low back pain: Secondary | ICD-10-CM | POA: Diagnosis not present

## 2023-05-23 DIAGNOSIS — E119 Type 2 diabetes mellitus without complications: Secondary | ICD-10-CM | POA: Diagnosis not present

## 2023-05-24 ENCOUNTER — Ambulatory Visit
Admission: RE | Admit: 2023-05-24 | Discharge: 2023-05-24 | Disposition: A | Discharge status: Home / Self Care | Payer: Medicare Other | Source: Ambulatory Visit | Attending: Pain Medicine | Admitting: Pain Medicine

## 2023-05-24 ENCOUNTER — Other Ambulatory Visit: Payer: Self-pay | Admitting: Pain Medicine

## 2023-05-24 DIAGNOSIS — M160 Bilateral primary osteoarthritis of hip: Secondary | ICD-10-CM | POA: Diagnosis not present

## 2023-05-24 DIAGNOSIS — M25552 Pain in left hip: Secondary | ICD-10-CM

## 2023-05-24 DIAGNOSIS — M5451 Vertebrogenic low back pain: Secondary | ICD-10-CM | POA: Diagnosis not present

## 2023-05-24 DIAGNOSIS — M47816 Spondylosis without myelopathy or radiculopathy, lumbar region: Secondary | ICD-10-CM | POA: Diagnosis not present

## 2023-05-26 DIAGNOSIS — M5451 Vertebrogenic low back pain: Secondary | ICD-10-CM | POA: Diagnosis not present

## 2023-05-30 DIAGNOSIS — M5451 Vertebrogenic low back pain: Secondary | ICD-10-CM | POA: Diagnosis not present

## 2023-06-02 DIAGNOSIS — M5451 Vertebrogenic low back pain: Secondary | ICD-10-CM | POA: Diagnosis not present

## 2023-06-07 DIAGNOSIS — M5451 Vertebrogenic low back pain: Secondary | ICD-10-CM | POA: Diagnosis not present

## 2023-06-12 ENCOUNTER — Other Ambulatory Visit: Payer: Self-pay

## 2023-06-12 ENCOUNTER — Emergency Department (HOSPITAL_BASED_OUTPATIENT_CLINIC_OR_DEPARTMENT_OTHER)
Admission: EM | Admit: 2023-06-12 | Discharge: 2023-06-12 | Disposition: A | Discharge status: Home / Self Care | Payer: Medicare Other | Source: Home / Self Care | Attending: Emergency Medicine | Admitting: Emergency Medicine

## 2023-06-12 DIAGNOSIS — Z7984 Long term (current) use of oral hypoglycemic drugs: Secondary | ICD-10-CM | POA: Diagnosis not present

## 2023-06-12 DIAGNOSIS — Z79899 Other long term (current) drug therapy: Secondary | ICD-10-CM | POA: Insufficient documentation

## 2023-06-12 DIAGNOSIS — I83892 Varicose veins of left lower extremities with other complications: Secondary | ICD-10-CM | POA: Diagnosis not present

## 2023-06-12 DIAGNOSIS — E039 Hypothyroidism, unspecified: Secondary | ICD-10-CM | POA: Insufficient documentation

## 2023-06-12 DIAGNOSIS — Z7901 Long term (current) use of anticoagulants: Secondary | ICD-10-CM | POA: Insufficient documentation

## 2023-06-12 DIAGNOSIS — I10 Essential (primary) hypertension: Secondary | ICD-10-CM | POA: Insufficient documentation

## 2023-06-12 DIAGNOSIS — I83899 Varicose veins of unspecified lower extremities with other complications: Secondary | ICD-10-CM

## 2023-06-12 MED ORDER — SILVER NITRATE-POT NITRATE 75-25 % EX MISC: 1.0000 | Freq: Once | CUTANEOUS | Status: AC

## 2023-06-12 MED ORDER — SILVER NITRATE-POT NITRATE 75-25 % EX MISC
CUTANEOUS | Status: AC
  Administered 2023-06-12: 1 via TOPICAL
  Filled 2023-06-12: qty 10

## 2023-06-12 NOTE — ED Triage Notes (Signed)
POV from home, A&O x 4, GCS 15, bib wheelchair  Pt was shaving and cut LLE. Takes blood thinner. Blood oozing when bandage removed.

## 2023-06-12 NOTE — ED Provider Notes (Signed)
[facility] [facility]PART[facility] AT [facility] Provider Note   CSN: [id number] Arrival date & time: 06/12/23  2146     History  Chief Complaint  Patient presents with   Extremity Laceration    Anne Lang is a 72 y.o. female.  HPI Patient on Eliquis for atrial fibrillation.  Has bleeding from left lower leg.  States while she was shaving it started to bleed.  Was squirting some blood.  Anterior left lower leg has bleeding area.  It is initially wrapped.   Past Medical History:  Diagnosis Date   Arthritis    Atrial fibrillation (HCC)    Diabetes mellitus without complication (HCC)    Hemorrhoids    Hyperlipidemia    Hypertension    Hypothyroidism    Impingement syndrome of right shoulder     Home Medications Prior to Admission medications   Medication Sig Start Date End Date Taking? Authorizing Provider  acetaminophen (TYLENOL) 500 MG tablet Take 1,000 mg by mouth 2 (two) times daily.    [provider]  apixaban (ELIQUIS) 5 MG TABS tablet TAKE 1 TABLET TWICE A DAY 01/03/23   Tolia, Sunit, DO  Ascorbic Acid (VITAMIN C PO) Take 1,000 mg by mouth daily.    [provider]  atorvastatin (LIPITOR) 10 MG tablet Take 5 mg by mouth daily.    [provider]  buPROPion (WELLBUTRIN XL) 150 MG 24 hr tablet Take 150 mg by mouth every morning. 03/29/22   [provider]  Calcium Carbonate-Vit D-Min (CALCIUM 1200 PO) Take 600 mg by mouth daily.    [provider]  diltiazem (CARDIZEM CD) 360 MG 24 hr capsule TAKE 1 CAPSULE EVERY       MORNING 12/06/22   Tolia, Sunit, DO  docusate sodium (COLACE) 100 MG capsule Take 100 mg by mouth daily.    [provider]  ferrous sulfate 325 (65 FE) MG tablet Take 325 mg by mouth 3 (three) times a week.    [provider]  flecainide (TAMBOCOR) 100 MG tablet TAKE 1 TABLET TWICE A DAY 02/18/23   Tolia, Sunit, DO  furosemide (LASIX) 20 MG tablet Take 10 mg by mouth as needed  for fluid. TAKE 1/2 TABLET    [provider]  hydrochlorothiazide (HYDRODIURIL) 25 MG tablet Take 25 mg by mouth every morning. 10/18/14   [provider]  levothyroxine (SYNTHROID, LEVOTHROID) 150 MCG tablet Take 150 mcg by mouth daily before breakfast.    [provider]  metFORMIN (GLUCOPHAGE) 1000 MG tablet Take 1,000 mg by mouth 2 (two) times daily with a meal.    [provider]  Multiple Vitamins-Minerals (CENTRUM SILVER PO) Take 1 tablet by mouth daily.    [provider]  ramipril (ALTACE) 10 MG capsule Take 10 mg by mouth every morning.    [provider]  TRULICITY 4.5 MG/0.5ML SOPN Inject into the skin once a week. 06/02/22   [provider]  Vitamin D3 (VITAMIN D) 25 MCG tablet Take 1,000 Units by mouth daily.    [provider]      Allergies    Empagliflozin, Pioglitazone, and Sulfa antibiotics    Review of Systems   Review of Systems  Physical Exam Updated Vital Signs BP (!) 121/57   Pulse 62   Temp 98.2 F (36.8 C) (Oral)   Resp 18   Ht 5\' 5"  (1.651 m)   Wt 98 kg   SpO2 94%   BMI 35.94 kg/m  Physical Exam Vitals and nursing note reviewed.  Skin:    Comments: Some visible varicose veins on the anterior left lower leg.  Initially no bleeding but began to bleed on the superior aspect.  Somewhat brisk bleeding but from a small area.  Neurological:     Mental Status: She is alert.     ED Results / Procedures / Treatments   Labs (all labs ordered are listed, but only abnormal results are displayed) Labs Reviewed - No data to display  EKG None  Radiology No results found.  Procedures Procedures    Medications Ordered in ED Medications  silver nitrate applicators applicator 1 Application (1 Application Topical Given by Other 06/12/23 2233)    ED Course/ Medical Decision Making/ A&P                                 Medical Decision Making Risk Prescription drug  management.   Patient bleeding from varicose vein on left lower leg.  Did have some shaving that likely caused the injury.  Direct pressure did not initially stop the bleeding but silver nitrate was able to control it.  Wrapped with nonadherent dressing then 2 x 2 wrapped with [place].  No signs of infection.  Dressing can be removed in a couple days.  Appears stable for discharge home.        Final Clinical Impression(s) / ED Diagnoses Final diagnoses:  Bleeding from varicose vein    Rx / DC Orders ED Discharge Orders     None         Benjiman Core, MD 06/12/23 2242

## 2023-06-12 NOTE — Discharge Instructions (Signed)
The dressing can come off in a day or 2.  You can continue to take the Eliquis.

## 2023-06-22 DIAGNOSIS — M5451 Vertebrogenic low back pain: Secondary | ICD-10-CM | POA: Diagnosis not present

## 2023-06-25 ENCOUNTER — Other Ambulatory Visit: Payer: Self-pay | Admitting: Cardiology

## 2023-06-25 DIAGNOSIS — Z79899 Other long term (current) drug therapy: Secondary | ICD-10-CM

## 2023-06-25 DIAGNOSIS — I4819 Other persistent atrial fibrillation: Secondary | ICD-10-CM

## 2023-06-27 ENCOUNTER — Other Ambulatory Visit: Payer: Self-pay

## 2023-06-27 DIAGNOSIS — Z7901 Long term (current) use of anticoagulants: Secondary | ICD-10-CM

## 2023-06-27 DIAGNOSIS — Z79899 Other long term (current) drug therapy: Secondary | ICD-10-CM

## 2023-06-27 DIAGNOSIS — I480 Paroxysmal atrial fibrillation: Secondary | ICD-10-CM

## 2023-06-28 NOTE — Telephone Encounter (Signed)
Yes refill   ST

## 2023-06-28 NOTE — Telephone Encounter (Signed)
Refill request

## 2023-06-29 DIAGNOSIS — Z79899 Other long term (current) drug therapy: Secondary | ICD-10-CM | POA: Diagnosis not present

## 2023-06-29 DIAGNOSIS — I480 Paroxysmal atrial fibrillation: Secondary | ICD-10-CM | POA: Diagnosis not present

## 2023-06-29 DIAGNOSIS — Z7901 Long term (current) use of anticoagulants: Secondary | ICD-10-CM | POA: Diagnosis not present

## 2023-06-30 LAB — BASIC METABOLIC PANEL
BUN/Creatinine Ratio: 17 (ref 12–28)
BUN: 12 mg/dL (ref 8–27)
CO2: 25 mmol/L (ref 20–29)
Calcium: 10 mg/dL (ref 8.7–10.3)
Chloride: 96 mmol/L (ref 96–106)
Creatinine, Ser: 0.72 mg/dL (ref 0.57–1.00)
Glucose: 110 mg/dL — ABNORMAL HIGH (ref 70–99)
Potassium: 4.1 mmol/L (ref 3.5–5.2)
Sodium: 137 mmol/L (ref 134–144)
eGFR: 88 mL/min/{1.73_m2} (ref >59)

## 2023-06-30 LAB — HEMOGLOBIN AND HEMATOCRIT, BLOOD
Hematocrit: 41.8 % (ref 34.0–46.6)
Hemoglobin: 13.3 g/dL (ref 11.1–15.9)

## 2023-07-04 DIAGNOSIS — Z7185 Encounter for immunization safety counseling: Secondary | ICD-10-CM | POA: Diagnosis not present

## 2023-07-04 DIAGNOSIS — Z6835 Body mass index (BMI) 35.0-35.9, adult: Secondary | ICD-10-CM | POA: Diagnosis not present

## 2023-07-04 DIAGNOSIS — N3281 Overactive bladder: Secondary | ICD-10-CM | POA: Diagnosis not present

## 2023-07-07 DIAGNOSIS — M5451 Vertebrogenic low back pain: Secondary | ICD-10-CM | POA: Diagnosis not present

## 2023-07-18 ENCOUNTER — Encounter: Payer: Self-pay | Admitting: Cardiology

## 2023-07-18 ENCOUNTER — Ambulatory Visit: Payer: Medicare Other | Admitting: Cardiology

## 2023-07-18 VITALS — BP 131/64 | HR 62 | Resp 16 | Ht 65.0 in | Wt 219.4 lb

## 2023-07-18 DIAGNOSIS — Z79899 Other long term (current) drug therapy: Secondary | ICD-10-CM | POA: Diagnosis not present

## 2023-07-18 DIAGNOSIS — I480 Paroxysmal atrial fibrillation: Secondary | ICD-10-CM | POA: Diagnosis not present

## 2023-07-18 DIAGNOSIS — I10 Essential (primary) hypertension: Secondary | ICD-10-CM

## 2023-07-18 DIAGNOSIS — G4730 Sleep apnea, unspecified: Secondary | ICD-10-CM | POA: Diagnosis not present

## 2023-07-18 DIAGNOSIS — E119 Type 2 diabetes mellitus without complications: Secondary | ICD-10-CM | POA: Diagnosis not present

## 2023-07-18 DIAGNOSIS — Z7901 Long term (current) use of anticoagulants: Secondary | ICD-10-CM

## 2023-07-18 NOTE — Progress Notes (Signed)
Date:  07/18/2023   ID:  Anne Lang, DOB [birth year], MRN [id number]  PCP:  Jackelyn Poling, DO  Cardiologist:  Tessa Lerner, DO, [facility] (established care 02/01/2022)  Date: 07/18/23 Last Office Visit: 10/12/2022  Chief Complaint  Patient presents with   Paroxysmal atrial fibrillation [facility])    HPI  Anne Lang is a 72 y.o. Caucasian female whose past medical history and cardiovascular risk factors include: Hyperlipidemia, hypertension, diabetes melitis type II, sleep apnea, depression, persistent atrial fibrillation s/p DDCV (02/24/2022).  In December 2022 prior to her cataract surgery she was diagnosed with atrial flutter but did not seek medical attention until closer to March 2023.  Initially she did undergo TEE guided cardioversion with 200 J x 1 however by the 2-week follow-up visit she had reverted back to A-fib.  She was started on flecainide and also was evaluated/treated for sleep apnea.  She is scheduled for a repeat direct-current cardioversion but on the day of the procedure she was noted to be in sinus rhythm and the procedure was canceled.  Since then she has remained in sinus rhythm.  She does go in and out of A-fib and she is able to appreciate these episodes.  Since last office visit patient remains stable and denies anginal chest pain or heart failure symptoms.  It has been several months since he is noticed A-fib episodes and when they do occur they last for a few days according to her.  Recent labs independently reviewed which notes stable renal function, electrolytes, and hemoglobin levels.  She has lost approximately 14 pounds since last office visit.  She is currently on Mounjaro.  FUNCTIONAL STATUS: Swimming three days a week at [facility] and bowling.    ALLERGIES: Allergies  Allergen Reactions   Empagliflozin Other (See Comments)    Other reaction(s): Yeast infections, itching   Pioglitazone Other (See Comments)    dependent edema   Sulfa  Antibiotics Other (See Comments)    Urinary tract infection (1977)    MEDICATION LIST PRIOR TO VISIT: Current Meds  Medication Sig   acetaminophen (TYLENOL) 500 MG tablet Take 1,000 mg by mouth 2 (two) times daily.   apixaban (ELIQUIS) 5 MG TABS tablet TAKE 1 TABLET TWICE A DAY   Ascorbic Acid (VITAMIN C PO) Take 1,000 mg by mouth daily.   atorvastatin (LIPITOR) 10 MG tablet Take 5 mg by mouth daily.   buPROPion (WELLBUTRIN XL) 150 MG 24 hr tablet Take 150 mg by mouth every morning.   Calcium Carbonate-Vit D-Min (CALCIUM 1200 PO) Take 600 mg by mouth daily.   diltiazem (CARDIZEM CD) 360 MG 24 hr capsule TAKE 1 CAPSULE EVERY       MORNING   docusate sodium (COLACE) 100 MG capsule Take 100 mg by mouth daily.   ferrous sulfate 325 (65 FE) MG tablet Take 325 mg by mouth 3 (three) times a week.   flecainide (TAMBOCOR) 100 MG tablet TAKE 1 TABLET TWICE A DAY   furosemide (LASIX) 20 MG tablet Take 10 mg by mouth as needed for fluid. TAKE 1/2 TABLET   hydrochlorothiazide (HYDRODIURIL) 25 MG tablet Take 25 mg by mouth every morning.   levothyroxine (SYNTHROID, LEVOTHROID) 150 MCG tablet Take 150 mcg by mouth daily before breakfast.   metFORMIN (GLUCOPHAGE) 1000 MG tablet Take 1,000 mg by mouth 2 (two) times daily with a meal.   MOUNJARO 7.5 MG/0.5ML Pen Inject 7.5 mg Subcutaneous Once a week for 30 days   Multiple Vitamins-Minerals (CENTRUM SILVER PO)  Take 1 tablet by mouth daily.   oxybutynin (DITROPAN) 5 MG tablet Take 5 mg by mouth daily.   ramipril (ALTACE) 10 MG capsule Take 10 mg by mouth every morning.   Vitamin D3 (VITAMIN D) 25 MCG tablet Take 1,000 Units by mouth daily.   [DISCONTINUED] TRULICITY 4.5 MG/0.5ML SOPN Inject into the skin once a week.     PAST MEDICAL HISTORY: Past Medical History:  Diagnosis Date   Arthritis    Atrial fibrillation (HCC)    Diabetes mellitus without complication (HCC)    Hemorrhoids    Hyperlipidemia    Hypertension    Hypothyroidism     Impingement syndrome of right shoulder     PAST SURGICAL HISTORY: Past Surgical History:  Procedure Laterality Date   BUBBLE STUDY  02/24/2022   Procedure: BUBBLE STUDY;  Surgeon: Tessa Lerner, DO;  [facility];  Service: Cardiovascular;;   CARDIOVERSION N/A 02/24/2022   Procedure: CARDIOVERSION;  Surgeon: Tessa Lerner, DO;  [facility]: [facility];  Service: Cardiovascular;  Laterality: N/A;  shocked @ 1323 @200j  x1   colonscopy      ingrown toenail     1969    SQUAMOUS CELL CARCINOMA EXCISION Right 09/13/2022   TEE WITHOUT CARDIOVERSION N/A 02/24/2022   Procedure: TRANSESOPHAGEAL ECHOCARDIOGRAM (TEE);  Surgeon: Tessa Lerner, DO;  Location: [facility];  Service: Cardiovascular;  Laterality: N/A;   TONSILLECTOMY     TOTAL KNEE ARTHROPLASTY Left 12/09/2014   Procedure: LEFT TOTAL KNEE ARTHROPLASTY;  Surgeon: Loanne Drilling, MD;  Location: [facility];  Service: Orthopedics;  Laterality: Left;   trauma to left thumb     with surgical repair    TUBAL LIGATION      FAMILY HISTORY: The patient family history includes Arthritis/Rheumatoid in her sister; Atrial fibrillation in her sister; Congestive Heart Failure in her mother; Emphysema in her mother; Heart attack in her brother; Heart attack (age of onset: 23) in her father; Hypertension in her brother; Other in her brother and sister.  SOCIAL HISTORY:  The patient  reports that she has never smoked. She has never used smokeless tobacco. She reports that she does not drink alcohol and does not use drugs.  REVIEW OF SYSTEMS: Review of Systems  Constitutional: Positive for weight loss. Negative for malaise/fatigue.  Cardiovascular:  Negative for chest pain, dyspnea on exertion, leg swelling, palpitations and syncope.  Respiratory:  Negative for shortness of breath.     PHYSICAL EXAM:    07/18/2023   10:15 AM 06/12/2023   10:30 PM 06/12/2023    9:54 PM  Vitals with BMI  Height 5\' 5"   5\' 5"   Weight 219 lbs 6 oz  216 lbs  BMI  36.51  35.94  Systolic 131 121   Diastolic 64 57   Pulse 62 62    Physical Exam  Constitutional: No distress.  Age appropriate, hemodynamically stable.   Neck: No JVD present.  Cardiovascular: Normal rate, regular rhythm, S1 normal, S2 normal, intact distal pulses and normal pulses. Exam reveals no gallop, no S3 and no S4.  Murmur heard. High-pitched blowing holosystolic murmur is present with a grade of 3/6 at the apex. Pulmonary/Chest: Effort normal and breath sounds normal. No stridor. She has no wheezes. She has no rales.  Abdominal: Soft. Bowel sounds are normal. She exhibits no distension. There is no abdominal tenderness.  Musculoskeletal:        General: Edema present.     Cervical back: Neck supple.  Neurological: She is alert  and oriented to person, place, and time. She has intact cranial nerves (2-12).  Skin: Skin is warm and moist.   CARDIAC DATABASE: DDCV 02/24/2022: 200J x 1 converted to NSR.   EKG: 02/01/2022: Atrial fibrillation, 74 bpm, normal axis, without underlying injury pattern. 02/24/2022: NSR, 84 bpm, without underlying ischemia or injury pattern. June 11, 2022: Atrial fibrillation, 95 bpm, normal axis, without underlying injury pattern. 10/12/2022: Normal sinus rhythm, 64 bpm, without underlying ischemia or injury pattern. July 18, 2023: Sinus rhythm, 62 bpm, without underlying ischemia injury pattern.  Echocardiogram: 03/16/2022: Left ventricle cavity is normal in size.  Mild concentric remodeling of the left ventricle. Normal global wall motion. Normal LV systolic function with EF 55%. Unable to evaluate diastolic function due to atrial fibrillation.  Left atrial cavity is moderately dilated. Mild to moderate mitral regurgitation. Mild to moderate tricuspid regurgitation. Mild pulmonic regurgitation. The IVC is not well visualized.   Stress Testing: Lexiscan (Modified Bruce Protocol) Tetrofosmin stress test 03/22/2022: 1 Day Rest/Stress protocol.   Stress EKG is non-diagnostic, due to baseline ST-T changes. Exercise time 4 minutes, achieved 2.28 METS, 135% APMHR. Normal myocardial perfusion without convincing evidence of reversible myocardial ischemia or prior infarct. Left ventricular size mildly dilated (EDV 122 cc), left ventricular wall thickness and wall motion preserved.  Calculated LVEF 63%. Low risk study.  Heart Catheterization: None  LABORATORY DATA:    Latest Ref Rng & Units 06/29/2023    9:49 AM 07/05/2022    2:37 PM 02/09/2022    9:49 AM  CBC  WBC 3.4 - 10.8 x10E3/uL  8.8    Hemoglobin 11.1 - 15.9 g/dL 14.7  82.9  56.2   Hematocrit 34.0 - 46.6 % 41.8  40.8  41.2   Platelets 150 - 450 x10E3/uL  237         Latest Ref Rng & Units 06/29/2023    9:49 AM 07/05/2022    2:37 PM 02/09/2022    9:48 AM  CMP  Glucose 70 - 99 mg/dL 130  865  784   BUN 8 - 27 mg/dL 12  13  14    Creatinine 0.57 - 1.00 mg/dL 6.96  2.95  2.84   Sodium 134 - 144 mmol/L 137  138  137   Potassium 3.5 - 5.2 mmol/L 4.1  4.0  4.3   Chloride 96 - 106 mmol/L 96  97  94   CO2 20 - 29 mmol/L 25  27  24    Calcium 8.7 - 10.3 mg/dL 13.2  44.0  9.9   Total Protein 6.0 - 8.5 g/dL  7.0    Total Bilirubin 0.0 - 1.2 mg/dL  0.2    Alkaline Phos 44 - 121 IU/L  93    AST 0 - 40 IU/L  17    ALT 0 - 32 IU/L  16      Lipid Panel  No results found for: "CHOL", "TRIG", "HDL", "CHOLHDL", "VLDL", "LDLCALC", "LDLDIRECT", "LABVLDL"  No components found for: "NTPROBNP" No results for input(s): "PROBNP" in the last 8760 hours. No results for input(s): "TSH" in the last 8760 hours.  BMP Recent Labs    06/29/23 0949  NA 137  K 4.1  CL 96  CO2 25  GLUCOSE 110*  BUN 12  CREATININE 0.72  CALCIUM 10.0    HEMOGLOBIN A1C No results found for: "HGBA1C", "MPG"  External Labs: Collected: 01/25/2022. Hemoglobin 14.4 g/dL, hematocrit 10.2%. BUN 14, creatinine 0.82 mg/dL. Sodium 141, potassium 4, chloride 102, bicarb 31. AST  21, ALT 22, alkaline phosphatase  69 Hemoglobin A1c 8.4. Total cholesterol 161, triglycerides 132, HDL 59, LDL 79, non-HDL 102. TSH 0.94   IMPRESSION:    ICD-10-CM   1. Paroxysmal atrial fibrillation (HCC)  I48.0 EKG 12-Lead    2. Long term (current) use of anticoagulants  Z79.01     3. Long term current use of antiarrhythmic drug  Z79.899     4. Sleep apnea, unspecified type  G47.30     5. Benign hypertension  I10     6. Non-insulin dependent type 2 diabetes mellitus (HCC)  E11.9        RECOMMENDATIONS: Parthena Edstrom is a 72 y.o. Caucasian female whose past medical history and cardiac risk factors include: Hyperlipidemia, hypertension, diabetes melitis type II, sleep apnea, depression, persistent atrial fibrillation s/p DDCV (02/24/2022).  Paroxysmal atrial fibrillation (HCC) Rate control: Diltiazem. Rhythm control: Flecainide. Thromboembolic prophylaxis: Eliquis [id number] SCORE is 4 which correlates to 4% risk of stroke per year (age, gender, DM, HTN).  History of TEE guided cardioversion 200 J x 1.  Has not done well with beta-blockers in the past due to depressive thoughts EKG today illustrates sinus rhythm. Reemphasized importance of CPAP use. Congratulated on the weight loss of 14 pounds in the last office visit  Long term (current) use of anticoagulants Indication: Paroxysmal atrial fibrillation. Currently on Eliquis. Does not endorse evidence of bleeding. Discussed the risks, benefits, and alternatives to oral anticoagulation. Patient does not want to be considered for Watchman device at this time. His last office visit she had 1 episode of bleeding secondary to shaving her legs for which she required to be seen by ED provider. Hemoglobin remained stable.  Non-insulin dependent type 2 diabetes mellitus (HCC) Educated the importance of glycemic control.  Benign hypertension Office blood pressures are well-controlled. Medications reconciled. No changes warranted.    Patient has an  appointment for her yearly physical with PCP in March 2025.  She will get labs at that time and send [place] a copy for reference.  I would like to see her back in 1 year in September 2025 and she will have labs prior to that she will call the office to arrange these labs.  FINAL MEDICATION LIST END OF ENCOUNTER: No orders of the defined types were placed in this encounter.   Medications Discontinued During This Encounter  Medication Reason   TRULICITY 4.5 MG/0.5ML SOPN      Current Outpatient Medications:    acetaminophen (TYLENOL) 500 MG tablet, Take 1,000 mg by mouth 2 (two) times daily., Disp: , Rfl:    apixaban (ELIQUIS) 5 MG TABS tablet, TAKE 1 TABLET TWICE A DAY, Disp: 180 tablet, Rfl: 3   Ascorbic Acid (VITAMIN C PO), Take 1,000 mg by mouth daily., Disp: , Rfl:    atorvastatin (LIPITOR) 10 MG tablet, Take 5 mg by mouth daily., Disp: , Rfl:    buPROPion (WELLBUTRIN XL) 150 MG 24 hr tablet, Take 150 mg by mouth every morning., Disp: , Rfl:    Calcium Carbonate-Vit D-Min (CALCIUM 1200 PO), Take 600 mg by mouth daily., Disp: , Rfl:    diltiazem (CARDIZEM CD) 360 MG 24 hr capsule, TAKE 1 CAPSULE EVERY       MORNING, Disp: 90 capsule, Rfl: 3   docusate sodium (COLACE) 100 MG capsule, Take 100 mg by mouth daily., Disp: , Rfl:    ferrous sulfate 325 (65 FE) MG tablet, Take 325 mg by mouth 3 (three) times a week., Disp: ,  Rfl:    flecainide (TAMBOCOR) 100 MG tablet, TAKE 1 TABLET TWICE A DAY, Disp: 180 tablet, Rfl: 0   furosemide (LASIX) 20 MG tablet, Take 10 mg by mouth as needed for fluid. TAKE 1/2 TABLET, Disp: , Rfl:    hydrochlorothiazide (HYDRODIURIL) 25 MG tablet, Take 25 mg by mouth every morning., Disp: , Rfl: 0   levothyroxine (SYNTHROID, LEVOTHROID) 150 MCG tablet, Take 150 mcg by mouth daily before breakfast., Disp: , Rfl:    metFORMIN (GLUCOPHAGE) 1000 MG tablet, Take 1,000 mg by mouth 2 (two) times daily with a meal., Disp: , Rfl:    MOUNJARO 7.5 MG/0.5ML Pen, Inject 7.5 mg  Subcutaneous Once a week for 30 days, Disp: , Rfl:    Multiple Vitamins-Minerals (CENTRUM SILVER PO), Take 1 tablet by mouth daily., Disp: , Rfl:    oxybutynin (DITROPAN) 5 MG tablet, Take 5 mg by mouth daily., Disp: , Rfl:    ramipril (ALTACE) 10 MG capsule, Take 10 mg by mouth every morning., Disp: , Rfl:    Vitamin D3 (VITAMIN D) 25 MCG tablet, Take 1,000 Units by mouth daily., Disp: , Rfl:   Orders Placed This Encounter  Procedures   EKG 12-Lead    There are no Patient Instructions on file for this visit.   --Continue cardiac medications as reconciled in final medication list. --Return in about 1 year (around 07/17/2024) for Follow up, A. fib. Or sooner if needed. --Continue follow-up with your primary care physician regarding the management of your other chronic comorbid conditions.  Patient's questions and concerns were addressed to her satisfaction. She voices understanding of the instructions provided during this encounter.   This note was created using a voice recognition software as a result there may be grammatical errors inadvertently enclosed that do not reflect the nature of this encounter. Every attempt is made to correct such errors.  Tessa Lerner, [place], [facility]  Pager:  [phone] Office: [phone]

## 2023-07-21 DIAGNOSIS — M5451 Vertebrogenic low back pain: Secondary | ICD-10-CM | POA: Diagnosis not present

## 2023-07-21 NOTE — Telephone Encounter (Signed)
error 

## 2023-08-30 DIAGNOSIS — I10 Essential (primary) hypertension: Secondary | ICD-10-CM | POA: Diagnosis not present

## 2023-08-30 DIAGNOSIS — E1140 Type 2 diabetes mellitus with diabetic neuropathy, unspecified: Secondary | ICD-10-CM | POA: Diagnosis not present

## 2023-08-30 DIAGNOSIS — E7800 Pure hypercholesterolemia, unspecified: Secondary | ICD-10-CM | POA: Diagnosis not present

## 2023-08-30 DIAGNOSIS — E1169 Type 2 diabetes mellitus with other specified complication: Secondary | ICD-10-CM | POA: Diagnosis not present

## 2023-09-01 DIAGNOSIS — M1811 Unilateral primary osteoarthritis of first carpometacarpal joint, right hand: Secondary | ICD-10-CM | POA: Diagnosis not present

## 2023-09-05 ENCOUNTER — Ambulatory Visit (INDEPENDENT_AMBULATORY_CARE_PROVIDER_SITE_OTHER): Payer: Medicare Other | Admitting: Podiatry

## 2023-09-05 DIAGNOSIS — M2042 Other hammer toe(s) (acquired), left foot: Secondary | ICD-10-CM | POA: Diagnosis not present

## 2023-09-05 DIAGNOSIS — M2041 Other hammer toe(s) (acquired), right foot: Secondary | ICD-10-CM

## 2023-09-05 DIAGNOSIS — L299 Pruritus, unspecified: Secondary | ICD-10-CM | POA: Diagnosis not present

## 2023-09-05 DIAGNOSIS — R601 Generalized edema: Secondary | ICD-10-CM

## 2023-09-05 DIAGNOSIS — E1140 Type 2 diabetes mellitus with diabetic neuropathy, unspecified: Secondary | ICD-10-CM

## 2023-09-05 DIAGNOSIS — E1151 Type 2 diabetes mellitus with diabetic peripheral angiopathy without gangrene: Secondary | ICD-10-CM | POA: Diagnosis not present

## 2023-09-05 MED ORDER — CLOTRIMAZOLE-BETAMETHASONE 1-0.05 % EX CREA: 1.0000 | TOPICAL_CREAM | Freq: Every day | CUTANEOUS | 0 refills | Status: DC

## 2023-09-05 NOTE — Progress Notes (Signed)
Chief Complaint  Patient presents with   Diabetes    DF EXAM, HAS BEEN HAVING TINGLING IN BOTH FEET, HAS BEEN GOING ON FOR AT LEAST A YEAR, HAS ITCHING ON BOTH FEET   HPI: 72 y.o. female presents today with several concerns today and has a list to review today: -Patient has tingling in her toes.  Mostly noticed when sitting disappears when active and ambulatory.  States is been going on for approximately 1 year.  Notes that her A1c went from over 7 to 6.8 recently with the addition of Mounjaro to her medication list. -Patient is wondering if she has hammertoe deformities and if there is anything she needs to do for these at this time.  Noticed some on the right.  Denies pain or irritation appreciated at this time. -Notes that she gets swelling in her legs.  States that 2 specialist already have recommended that she wear compression stockings but she would like to know exactly what she needs to get and where to get them.  States that this was not reviewed with her by the other 2 specialist. -Patient notes that she has cold feet.  She takes Eliquis for atrial fibrillation. -Patient notes that she has itching that occurs around the arches and the ankles.  States that she was informed by another provider that this can be caused from the swelling.  She does use [employer] cream for diabetics daily but she still occasionally gets itching.  Sometimes she uses Lotrisone cream on the area -Near the tail end of the appointment today as she asked if her nails could be trimmed.   Past Medical History:  Diagnosis Date   Arthritis    Atrial fibrillation (HCC)    Diabetes mellitus without complication (HCC)    Hemorrhoids    Hyperlipidemia    Hypertension    Hypothyroidism    Impingement syndrome of right shoulder     Past Surgical History:  Procedure Laterality Date   BUBBLE STUDY  02/24/2022   Procedure: BUBBLE STUDY;  Surgeon: Tessa Lerner, DO;  [facility]: [facility];  Service:  Cardiovascular;;   CARDIOVERSION N/A 02/24/2022   Procedure: CARDIOVERSION;  Surgeon: Tessa Lerner, DO;  [facility]: [facility];  Service: Cardiovascular;  Laterality: N/A;  shocked @ 1323 @200j  x1   colonscopy      ingrown toenail     1969    SQUAMOUS CELL CARCINOMA EXCISION Right 09/13/2022   TEE WITHOUT CARDIOVERSION N/A 02/24/2022   Procedure: TRANSESOPHAGEAL ECHOCARDIOGRAM (TEE);  Surgeon: Tessa Lerner, DO;  Location: [facility];  Service: Cardiovascular;  Laterality: N/A;   TONSILLECTOMY     TOTAL KNEE ARTHROPLASTY Left 12/09/2014   Procedure: LEFT TOTAL KNEE ARTHROPLASTY;  Surgeon: Loanne Drilling, MD;  Location: [facility];  Service: Orthopedics;  Laterality: Left;   trauma to left thumb     with surgical repair    TUBAL LIGATION      Allergies  Allergen Reactions   Empagliflozin Other (See Comments)    Other reaction(s): Yeast infections, itching   Pioglitazone Other (See Comments)    dependent edema   Sulfa Antibiotics Other (See Comments)    Urinary tract infection (7829)   Physical Exam: General: The patient is alert and oriented x3 in no acute distress.  Dermatology: Skin is warm, dry and supple bilateral lower extremities. Interspaces are clear of maceration and debris.  Nails appear normal with slight elongation.  No open lesions noted.  Vascular: Palpable pedal pulses bilaterally. Capillary  refill within normal limits.   +1 pitting edema bilateral lower legs and ankles.  The edema does not extend into the foot on exam this morning.  Neurological: Light touch sensation diminished in forefoot bilateral.  Protective sensation absent in the toes bilateral.  Manual muscle testing 5/5 bilateral.  Vibratory sensation absent to the forefoot bilateral.  Musculoskeletal Exam: Semiflexible contractures of the PIP joints of the lesser toes bilateral.  Right foot is slightly worse than left.  No areas of skin irritation noted   Assessment/Plan of Care: 1. Type II diabetes  mellitus with peripheral circulatory disorder (HCC)   2. Generalized edema   3. Pruritus   4. Type 2 diabetes mellitus with diabetic neuropathy, without long-term current use of insulin (HCC)   5. Hammertoe of right foot   6. Acquired hammertoe of left foot      Meds ordered this encounter  Medications   clotrimazole-betamethasone (LOTRISONE) cream    Sig: Apply 1 Application topically daily.    Dispense:  30 g    Refill:  0   Discussed clinical findings with patient today.  Recommended patient call on [employer] and purchase bilateral compression socks or stockings.  She was given 2 brand names to look up and try.  Also wrote down the exact compression for her to try as well.  Reviewed when to put these on and take these off each day.  Prescription Lotrisone cream for the intermittent itching that she experiences.  Did reinforce that continued swelling in the legs can exacerbate itching and cause a histamine release to the area.  When she starts wearing compression stockings she may not need the cream any longer.  Reviewed the causes of neuropathy.  Will hold off on prescribing long-term neuropathy medication until this starts affecting her ability to sleep or is having any balance issues.  Discussed the importance of proper blood sugar control and helping prevent neuropathy from getting worse.  Recommend patient wear shoes at all times since there were some signs of peripheral neuropathy.  I reviewed her hammertoe deformities informed the patient that surgical intervention is the only option for her to correct/straighten the toes.  There does not appear to be increased pressure on her shoes so we do not need to alter that she is at this time.  Informed the patient that being on a blood thinner can make the extremities feel cold.  It is due to the thinning of the blood and decreased retained wants in the extremities.  There is no cure while she is on Eliquis for this.  Just protect the  extremities with gloves or socks.  Noted that her arterial pulses and feet were good so this is not a sign of arterial blockage at this time.  Due to the amount of time taken to review these concerns and discuss treatment options, when she requested the nails be trimmed at the end of the visit she was informed that we will need to reschedule her for this as we are not allowed enough time to address multiple issues and 1 single visit.  Follow-up in approximately 3 weeks for nail trimming.   Clerance Lav, DPM, FACFAS [facility]     [address], [place] [address]  Office [phone]  Fax [phone]

## 2023-09-10 ENCOUNTER — Other Ambulatory Visit: Payer: Self-pay | Admitting: Cardiology

## 2023-09-10 ENCOUNTER — Other Ambulatory Visit: Payer: Self-pay | Admitting: Podiatry

## 2023-09-10 DIAGNOSIS — Z79899 Other long term (current) drug therapy: Secondary | ICD-10-CM

## 2023-09-10 DIAGNOSIS — I4819 Other persistent atrial fibrillation: Secondary | ICD-10-CM

## 2023-09-20 DIAGNOSIS — L820 Inflamed seborrheic keratosis: Secondary | ICD-10-CM | POA: Diagnosis not present

## 2023-09-20 DIAGNOSIS — L905 Scar conditions and fibrosis of skin: Secondary | ICD-10-CM | POA: Diagnosis not present

## 2023-10-24 ENCOUNTER — Ambulatory Visit (INDEPENDENT_AMBULATORY_CARE_PROVIDER_SITE_OTHER): Payer: Medicare Other | Admitting: Podiatry

## 2023-10-24 DIAGNOSIS — B351 Tinea unguium: Secondary | ICD-10-CM | POA: Diagnosis not present

## 2023-10-24 DIAGNOSIS — M79675 Pain in left toe(s): Secondary | ICD-10-CM

## 2023-10-24 DIAGNOSIS — M79674 Pain in right toe(s): Secondary | ICD-10-CM

## 2023-10-24 DIAGNOSIS — M2042 Other hammer toe(s) (acquired), left foot: Secondary | ICD-10-CM

## 2023-10-24 DIAGNOSIS — M2041 Other hammer toe(s) (acquired), right foot: Secondary | ICD-10-CM

## 2023-10-24 DIAGNOSIS — E1151 Type 2 diabetes mellitus with diabetic peripheral angiopathy without gangrene: Secondary | ICD-10-CM

## 2023-10-24 NOTE — Progress Notes (Signed)
Subjective:  Patient ID: Anne Lang, female    DOB: Aug 28, 1951,  MRN: [id number]   Anne Lang presents to clinic today for:  Chief Complaint  Patient presents with   Diabetes    DFC A1C - 7.0  . Patient notes nails are thick, discolored, elongated and painful in shoegear when trying to ambulate.  Patient notes that she is interested in diabetic shoes today  PCP is Jackelyn Poling, DO.  Past Medical History:  Diagnosis Date   Arthritis    Atrial fibrillation (HCC)    Diabetes mellitus without complication (HCC)    Hemorrhoids    Hyperlipidemia    Hypertension    Hypothyroidism    Impingement syndrome of right shoulder     Past Surgical History:  Procedure Laterality Date   BUBBLE STUDY  02/24/2022   Procedure: BUBBLE STUDY;  Surgeon: Tessa Lerner, DO;  Location: [facility];  Service: Cardiovascular;;   CARDIOVERSION N/A 02/24/2022   Procedure: CARDIOVERSION;  Surgeon: Tessa Lerner, DO;  Location: [facility];  Service: Cardiovascular;  Laterality: N/A;  shocked @ 1323 @200j  x1   colonscopy      ingrown toenail     1969    SQUAMOUS CELL CARCINOMA EXCISION Right 09/13/2022   TEE WITHOUT CARDIOVERSION N/A 02/24/2022   Procedure: TRANSESOPHAGEAL ECHOCARDIOGRAM (TEE);  Surgeon: Tessa Lerner, DO;  Location: [facility];  Service: Cardiovascular;  Laterality: N/A;   TONSILLECTOMY     TOTAL KNEE ARTHROPLASTY Left 12/09/2014   Procedure: LEFT TOTAL KNEE ARTHROPLASTY;  Surgeon: Loanne Drilling, MD;  Location: [facility];  Service: Orthopedics;  Laterality: Left;   trauma to left thumb     with surgical repair    TUBAL LIGATION      Allergies  Allergen Reactions   Empagliflozin Other (See Comments)    Other reaction(s): Yeast infections, itching   Pioglitazone Other (See Comments)    dependent edema   Sulfa Antibiotics Other (See Comments)    Urinary tract infection (1977)    Review of Systems: Negative except as noted in the HPI.  Objective:   Anne Lang is a pleasant 72 y.o. female in NAD. AAO x 3.  Vascular Examination: Capillary refill time is 3-5 seconds to toes bilateral. Palpable pedal pulses b/l LE. Digital hair present b/l.  Skin temperature gradient WNL b/l. No varicosities b/l. No cyanosis noted b/l.  There are multiple telangiectasias in her legs and ankles.  Dermatological Examination: Pedal skin with normal turgor, texture and tone b/l. No open wounds. No interdigital macerations b/l. Toenails x10 are 3mm thick, discolored, dystrophic with subungual debris. There is pain with compression of the nail plates.  They are elongated x10  Musculoskeletal Examination: Muscle strength 5/5 to all LE muscle groups b/l.  There are contractures of the lesser toes at the PIP joints bilateral  Assessment/Plan: 1. Pain due to onychomycosis of toenails of both feet   2. Type II diabetes mellitus with peripheral circulatory disorder (HCC)   3. Hammertoe of right foot   4. Acquired hammertoe of left foot    FOR HOME USE ONLY DME DIABETIC SHOE  The mycotic toenails were sharply debrided x10 with sterile nail nippers and a power debriding burr to decrease bulk/thickness and length.    Will place order for diabetic shoes with our pedorthist.  Patient qualifies for diabetic shoes due to diabetes with peripheral venous disease as well as hammertoe deformities.  Return in about 3 months (around 01/22/2024) for  DFC.   Alyda Megna Orland Mustard, DPM, FACFAS [facility]     [address] [place], [place] [address]                Office [phone]  Fax [phone]

## 2023-10-27 DIAGNOSIS — G4733 Obstructive sleep apnea (adult) (pediatric): Secondary | ICD-10-CM | POA: Diagnosis not present

## 2023-11-06 ENCOUNTER — Other Ambulatory Visit: Payer: Self-pay | Admitting: Cardiology

## 2023-11-06 DIAGNOSIS — I4819 Other persistent atrial fibrillation: Secondary | ICD-10-CM

## 2023-11-17 ENCOUNTER — Ambulatory Visit: Payer: Medicare Other

## 2023-11-17 DIAGNOSIS — E1140 Type 2 diabetes mellitus with diabetic neuropathy, unspecified: Secondary | ICD-10-CM

## 2023-11-17 DIAGNOSIS — M2142 Flat foot [pes planus] (acquired), left foot: Secondary | ICD-10-CM

## 2023-11-17 DIAGNOSIS — M2041 Other hammer toe(s) (acquired), right foot: Secondary | ICD-10-CM

## 2023-11-17 DIAGNOSIS — E1151 Type 2 diabetes mellitus with diabetic peripheral angiopathy without gangrene: Secondary | ICD-10-CM

## 2023-11-17 DIAGNOSIS — M2042 Other hammer toe(s) (acquired), left foot: Secondary | ICD-10-CM

## 2023-11-17 NOTE — Progress Notes (Signed)
 Patient presents to the office today for diabetic shoe and insole measuring.  Patient was measured with brannock device to determine size and width for 1 pair of extra depth shoes and foam casted for 3 pair of insoles.   Documentation of medical necessity will be sent to patient's treating diabetic doctor to verify and sign.   Patient's diabetic provider: Bernardino Boone Do   Shoes and insoles will be ordered at that time and patient will be notified for an appointment for fitting when they arrive.   Shoe size (per patient): 10 Brannock measurement: 9 Patient shoe selection- Shoe choice:   [id number] / [id number]  Shoe size ordered: 9.5 WD  ABN signed

## 2023-12-01 ENCOUNTER — Other Ambulatory Visit: Payer: Self-pay | Admitting: Family Medicine

## 2023-12-01 DIAGNOSIS — Z1231 Encounter for screening mammogram for malignant neoplasm of breast: Secondary | ICD-10-CM

## 2023-12-06 ENCOUNTER — Ambulatory Visit: Payer: Federal, State, Local not specified - PPO | Admitting: Podiatry

## 2023-12-29 ENCOUNTER — Other Ambulatory Visit: Payer: Medicare Other

## 2024-01-02 ENCOUNTER — Other Ambulatory Visit: Payer: Self-pay | Admitting: Cardiology

## 2024-01-02 DIAGNOSIS — Z7901 Long term (current) use of anticoagulants: Secondary | ICD-10-CM

## 2024-01-02 NOTE — Telephone Encounter (Signed)
 Prescription refill request for Eliquis received. Indication: AF Last office visit: 07/18/23  S Tolia DO Scr: 0.72 on 06/29/23  Epic Age: 73 Weight: 99.5kg  Based on above findings Eliquis 5mg  twice daily is the appropriate dose.  Refill approved.

## 2024-01-05 ENCOUNTER — Ambulatory Visit (INDEPENDENT_AMBULATORY_CARE_PROVIDER_SITE_OTHER): Payer: Medicare Other

## 2024-01-05 DIAGNOSIS — M2141 Flat foot [pes planus] (acquired), right foot: Secondary | ICD-10-CM

## 2024-01-05 DIAGNOSIS — M2042 Other hammer toe(s) (acquired), left foot: Secondary | ICD-10-CM

## 2024-01-05 DIAGNOSIS — E1140 Type 2 diabetes mellitus with diabetic neuropathy, unspecified: Secondary | ICD-10-CM | POA: Diagnosis not present

## 2024-01-05 DIAGNOSIS — M2142 Flat foot [pes planus] (acquired), left foot: Secondary | ICD-10-CM

## 2024-01-05 DIAGNOSIS — M2041 Other hammer toe(s) (acquired), right foot: Secondary | ICD-10-CM

## 2024-01-05 NOTE — Progress Notes (Signed)

## 2024-01-19 ENCOUNTER — Ambulatory Visit
Admission: RE | Admit: 2024-01-19 | Discharge: 2024-01-19 | Disposition: A | Discharge status: Home / Self Care | Payer: Medicare Other | Source: Ambulatory Visit | Attending: Family Medicine | Admitting: Family Medicine

## 2024-01-19 DIAGNOSIS — Z1231 Encounter for screening mammogram for malignant neoplasm of breast: Secondary | ICD-10-CM

## 2024-01-30 ENCOUNTER — Encounter: Payer: Self-pay | Admitting: Podiatry

## 2024-01-30 ENCOUNTER — Ambulatory Visit (INDEPENDENT_AMBULATORY_CARE_PROVIDER_SITE_OTHER): Payer: Medicare Other | Admitting: Podiatry

## 2024-01-30 DIAGNOSIS — M79674 Pain in right toe(s): Secondary | ICD-10-CM

## 2024-01-30 DIAGNOSIS — B351 Tinea unguium: Secondary | ICD-10-CM

## 2024-01-30 DIAGNOSIS — M79675 Pain in left toe(s): Secondary | ICD-10-CM | POA: Diagnosis not present

## 2024-01-30 NOTE — Progress Notes (Signed)
       Subjective:  Patient ID: Anne Lang, female    DOB: April 17, 1951,  MRN: [id number]  Patient notes nails are thick, discolored, elongated and painful in shoegear when trying to ambulate.  Patient got her diabetic shoes and is really pleased with them.  PCP is Jackelyn Poling, DO.  Past Medical History:  Diagnosis Date   Arthritis    Atrial fibrillation (HCC)    Diabetes mellitus without complication (HCC)    Hemorrhoids    Hyperlipidemia    Hypertension    Hypothyroidism    Impingement syndrome of right shoulder     Past Surgical History:  Procedure Laterality Date   BUBBLE STUDY  02/24/2022   Procedure: BUBBLE STUDY;  Surgeon: Tessa Lerner, DO;  [facility];  Service: Cardiovascular;;   CARDIOVERSION N/A 02/24/2022   Procedure: CARDIOVERSION;  Surgeon: Tessa Lerner, DO;  [facility];  Service: Cardiovascular;  Laterality: N/A;  shocked @ 1323 @200j  x1   colonscopy      ingrown toenail     1969    SQUAMOUS CELL CARCINOMA EXCISION Right 09/13/2022   TEE WITHOUT CARDIOVERSION N/A 02/24/2022   Procedure: TRANSESOPHAGEAL ECHOCARDIOGRAM (TEE);  Surgeon: Tessa Lerner, DO;  [facility]: [facility];  Service: Cardiovascular;  Laterality: N/A;   TONSILLECTOMY     TOTAL KNEE ARTHROPLASTY Left 12/09/2014   Procedure: LEFT TOTAL KNEE ARTHROPLASTY;  Surgeon: Loanne Drilling, MD;  Location: [facility];  Service: Orthopedics;  Laterality: Left;   trauma to left thumb     with surgical repair    TUBAL LIGATION      Allergies  Allergen Reactions   Empagliflozin Other (See Comments)    Other reaction(s): Yeast infections, itching   Pioglitazone Other (See Comments)    dependent edema   Sulfa Antibiotics Other (See Comments)    Urinary tract infection (1977)    Review of Systems: Negative except as noted in the HPI.  Objective:  Anne Lang is a pleasant 73 y.o. female in NAD. AAO x 3.  Vascular Examination: Capillary refill time is 3-5  seconds to toes bilateral. Palpable pedal pulses b/l LE. Digital hair present b/l.  Skin temperature gradient WNL b/l. No varicosities b/l. No cyanosis noted b/l.   Dermatological Examination: Pedal skin with normal turgor, texture and tone b/l. No open wounds. No interdigital macerations b/l. Toenails x10 are 3mm thick, discolored, dystrophic with subungual debris. There is pain with compression of the nail plates.  They are elongated x10  Assessment/Plan: 1. Pain due to onychomycosis of toenails of both feet     The mycotic toenails were sharply debrided x10 with sterile nail nippers and a power debriding burr to decrease bulk/thickness and length.    Return in about 3 months (around 05/01/2024) for [facility].   Clerance Lav, DPM, FACFAS [facility]     [address] [place], [place] [address]                Office [phone]  Fax [phone]

## 2024-02-13 ENCOUNTER — Telehealth: Payer: Self-pay | Admitting: Cardiology

## 2024-02-13 NOTE — Telephone Encounter (Signed)
 Will defer thyroid disease management to PCP.   Garen Woolbright [place], DO, [facility]

## 2024-02-13 NOTE — Telephone Encounter (Signed)
 Pt c/o medication issue:  1. Name of Medication: evothyroxine (SYNTHROID, LEVOTHROID) 150 MCG tablet   2. How are you currently taking this medication (dosage and times per day)? Take 150 mcg by mouth daily before breakfast   3. Are you having a reaction (difficulty breathing--STAT)? No  4. What is your medication issue? Pt states that PCP lowered above medication dosage to 137 MG. Pt would like for provider to look at current medications to make sure it does not interfere with heart meds. Pt also states that she has been experiencing dizziness and lightheadedness as well. She does not know if this comes from the dosage change for the fact that she had Covid Booster last week. Please advise

## 2024-02-14 NOTE — Telephone Encounter (Signed)
 Pt advised of Dr. Emelda Brothers recommendation.  Denies being in afib.  Says her PCP told her to check with cardiology d/t Flecainide & Diltiazem.  Patient reports that is she is not in afib, and made aware that symptoms should not be related if so.  Pt states she has been on flecainide and diltiazem for several years now, and understands that SE typically not occur after being on it for many years.   Aware to f/u w/ PCP.  Patient verbalized understanding and agreeable to plan.

## 2024-02-14 NOTE — Telephone Encounter (Signed)
 Patient is calling back due to not hearing back regarding this. Please advise.

## 2024-04-30 ENCOUNTER — Ambulatory Visit: Admitting: Podiatry

## 2024-06-18 ENCOUNTER — Telehealth: Payer: Self-pay | Admitting: Cardiology

## 2024-06-18 DIAGNOSIS — I480 Paroxysmal atrial fibrillation: Secondary | ICD-10-CM

## 2024-06-18 DIAGNOSIS — Z7901 Long term (current) use of anticoagulants: Secondary | ICD-10-CM

## 2024-06-18 DIAGNOSIS — I10 Essential (primary) hypertension: Secondary | ICD-10-CM

## 2024-06-18 NOTE — Telephone Encounter (Signed)
 Called patient back. She would like to get lab work done prior to her appointment in September. Will send message to Dr. Michele to let us  know which labs he needs, then we can order them and have patient come in before her appointment.

## 2024-06-18 NOTE — Telephone Encounter (Signed)
 BMP,H&H.   Anne Proctor Larwill, DO, FACC

## 2024-06-18 NOTE — Telephone Encounter (Signed)
 Pt is requesting a callback regarding her wanting to see bout lab orders before coming in for her 1 yr f/u in Sept. Please advise

## 2024-06-18 NOTE — Telephone Encounter (Signed)
 Placed order for H&H and BMET. Patient will have lab work done sometime before her appointment.

## 2024-06-24 ENCOUNTER — Other Ambulatory Visit: Payer: Self-pay | Admitting: Cardiology

## 2024-06-24 DIAGNOSIS — I4819 Other persistent atrial fibrillation: Secondary | ICD-10-CM

## 2024-06-24 DIAGNOSIS — Z7901 Long term (current) use of anticoagulants: Secondary | ICD-10-CM

## 2024-06-27 NOTE — Telephone Encounter (Signed)
 Eliquis  5mg  refill request received. Patient is 73 years old, weight-99.5kg, Crea-0.72 on 06/29/23, Diagnosis-Afib, and last seen by Dr. Michele on 07/18/23. Dose is appropriate based on dosing criteria. Will send in refill to requested pharmacy.

## 2024-07-03 ENCOUNTER — Ambulatory Visit: Payer: Self-pay | Admitting: Cardiology

## 2024-07-13 ENCOUNTER — Ambulatory Visit: Payer: Self-pay

## 2024-07-13 LAB — BASIC METABOLIC PANEL WITH GFR
BUN/Creatinine Ratio: 12 (ref 12–28)
BUN: 11 mg/dL (ref 8–27)
CO2: 22 mmol/L (ref 20–29)
Calcium: 9.9 mg/dL (ref 8.7–10.3)
Chloride: 99 mmol/L (ref 96–106)
Creatinine, Ser: 0.91 mg/dL (ref 0.57–1.00)
Glucose: 108 mg/dL — ABNORMAL HIGH (ref 70–99)
Potassium: 4 mmol/L (ref 3.5–5.2)
Sodium: 139 mmol/L (ref 134–144)
eGFR: 66 mL/min/1.73 (ref >59)

## 2024-07-13 LAB — HEMOGLOBIN AND HEMATOCRIT, BLOOD
Hematocrit: 38 % (ref 34.0–46.6)
Hemoglobin: 12.5 g/dL (ref 11.1–15.9)

## 2024-07-17 ENCOUNTER — Ambulatory Visit: Attending: Cardiology | Admitting: Cardiology

## 2024-07-17 ENCOUNTER — Encounter: Payer: Self-pay | Admitting: Cardiology

## 2024-07-17 VITALS — BP 138/73 | HR 67 | Resp 16 | Ht 65.0 in | Wt 200.4 lb

## 2024-07-17 DIAGNOSIS — I4819 Other persistent atrial fibrillation: Secondary | ICD-10-CM | POA: Diagnosis not present

## 2024-07-17 DIAGNOSIS — I10 Essential (primary) hypertension: Secondary | ICD-10-CM | POA: Diagnosis present

## 2024-07-17 DIAGNOSIS — Z7901 Long term (current) use of anticoagulants: Secondary | ICD-10-CM | POA: Insufficient documentation

## 2024-07-17 DIAGNOSIS — Z79899 Other long term (current) drug therapy: Secondary | ICD-10-CM | POA: Diagnosis present

## 2024-07-17 DIAGNOSIS — G4730 Sleep apnea, unspecified: Secondary | ICD-10-CM | POA: Diagnosis present

## 2024-07-17 DIAGNOSIS — I340 Nonrheumatic mitral (valve) insufficiency: Secondary | ICD-10-CM | POA: Diagnosis present

## 2024-07-17 DIAGNOSIS — E119 Type 2 diabetes mellitus without complications: Secondary | ICD-10-CM | POA: Diagnosis present

## 2024-07-17 MED ORDER — FLECAINIDE ACETATE 100 MG PO TABS
100.0000 mg | ORAL_TABLET | Freq: Two times a day (BID) | ORAL | 3 refills | Status: DC
Start: 2024-07-17 — End: 2024-09-03

## 2024-07-17 MED ORDER — DILTIAZEM HCL ER COATED BEADS 360 MG PO CP24
360.0000 mg | ORAL_CAPSULE | Freq: Every morning | ORAL | 3 refills | Status: AC
Start: 2024-07-17 — End: ?

## 2024-07-17 NOTE — Progress Notes (Signed)
 Cardiology Office Note:  .   Date:  07/17/2024  ID:  Anne Lang, DOB [birth year], MRN [id number] PCP:  Dayna Motto, DO  Former Cardiology Providers: None [facility] Providers Cardiologist:  Madonna Large, DO , [facility] (established care 02/01/2022) Electrophysiologist:  None  Click to update primary MD,subspecialty MD or APP then REFRESH:1}    Chief Complaint  Patient presents with   Atrial Fibrillation   Follow-up    History of Present Illness: .   Anne Lang is a 73 y.o. Caucasian female whose past medical history and cardiovascular risk factors includes: Hyperlipidemia, hypertension, diabetes melitis type II, sleep apnea, depression, persistent atrial fibrillation s/p DDCV (02/24/2022).   In December 2022 prior to her cataract surgery she was diagnosed with atrial flutter but did not seek medical attention until March 2023.  She initially underwent TEE-guided cardioversion with 200 J x 1 however by 2 weeks follow-up patient was back in A-fib.  She was started on flecainide  and was also evaluated/treated for sleep apnea.  She was scheduled for a repeat direct-current cardioversion but on the day of the procedure she was noted to be back in sinus rhythm and the procedure was canceled.  For majority of time patient remains in sinus rhythm.  However, she does go in and out of A-fib and she is able to appreciate these episodes.  She was last seen in the office in September 2024 and now presents for 1 year follow-up visit.  Over the last 1 year patient is doing well from a cardiovascular standpoint. Denies anginal chest pain or heart failure symptoms. She does appreciate when she goes in and out of atrial fibrillation, has occurred approximately 3 times over the last 1 year but short-lived.  She is doing well on her current AV nodal blocking agents, antiarrhythmics, and anticoagulation.  Most recent labs from 9//2025 independently reviewed.   Review of Systems: .    Review of Systems  Cardiovascular:  Negative for chest pain, claudication, irregular heartbeat, leg swelling, near-syncope, orthopnea, palpitations, paroxysmal nocturnal dyspnea and syncope.  Respiratory:  Negative for shortness of breath.   Hematologic/Lymphatic: Negative for bleeding problem.    Studies Reviewed:   EKG: EKG Interpretation Date/Time:  Tuesday July 17 2024 08:47:52 EDT Ventricular Rate:  67 PR Interval:  194 QRS Duration:  110 QT Interval:  420 QTC Calculation: 443 R Axis:   75  Text Interpretation: Normal sinus rhythm Incomplete left bundle branch block When compared with ECG of 28-Jul-2022 12:58, No significant change was found Confirmed by Large Madonna [phone]) on 07/17/2024 8:56:06 AM  Echocardiogram: 03/16/2022: Left ventricle cavity is normal in size.  Mild concentric remodeling of the left ventricle. Normal global wall motion. Normal LV systolic function with EF 55%. Unable to evaluate diastolic function due to atrial fibrillation.  Left atrial cavity is moderately dilated. Mild to moderate mitral regurgitation. Mild to moderate tricuspid regurgitation. Mild pulmonic regurgitation. The IVC is not well visualized.    Stress Testing: Lexiscan  (Modified Bruce Protocol) Tetrofosmin  stress test 03/22/2022: Low risk study.  RADIOLOGY: NA  Risk Assessment/Calculations:   Click Here to Calculate/Change CHADS2VASc Score The patient's CHADS2-VASc score is 4, indicating a 4.8% annual risk of stroke.  CHF History: No HTN History: Yes Diabetes History: Yes Stroke History: No Vascular Disease History: No  Labs:       Latest Ref Rng & Units 07/12/2024    8:14 AM 06/29/2023    9:49 AM 07/05/2022    2:37 PM  CBC  WBC 3.4 - 10.8 x10E3/uL   8.8   Hemoglobin 11.1 - 15.9 g/dL 87.4  86.6  86.2   Hematocrit 34.0 - 46.6 % 38.0  41.8  40.8   Platelets 150 - 450 x10E3/uL   237        Latest Ref Rng & Units 07/12/2024    8:14 AM 06/29/2023    9:49 AM 07/05/2022     2:37 PM  BMP  Glucose 70 - 99 mg/dL 891  889  882   BUN 8 - 27 mg/dL 11  12  13    Creatinine 0.57 - 1.00 mg/dL 9.08  9.27  9.19   BUN/Creat Ratio 12 - 28 12  17  16    Sodium 134 - 144 mmol/L 139  137  138   Potassium 3.5 - 5.2 mmol/L 4.0  4.1  4.0   Chloride 96 - 106 mmol/L 99  96  97   CO2 20 - 29 mmol/L 22  25  27    Calcium  8.7 - 10.3 mg/dL 9.9  89.9  89.9       Latest Ref Rng & Units 07/12/2024    8:14 AM 06/29/2023    9:49 AM 07/05/2022    2:37 PM  CMP  Glucose 70 - 99 mg/dL 891  889  882   BUN 8 - 27 mg/dL 11  12  13    Creatinine 0.57 - 1.00 mg/dL 9.08  9.27  9.19   Sodium 134 - 144 mmol/L 139  137  138   Potassium 3.5 - 5.2 mmol/L 4.0  4.1  4.0   Chloride 96 - 106 mmol/L 99  96  97   CO2 20 - 29 mmol/L 22  25  27    Calcium  8.7 - 10.3 mg/dL 9.9  89.9  89.9   Total Protein 6.0 - 8.5 g/dL   7.0   Total Bilirubin 0.0 - 1.2 mg/dL   0.2   Alkaline Phos 44 - 121 IU/L   93   AST 0 - 40 IU/L   17   ALT 0 - 32 IU/L   16     No results found for: CHOL, HDL, LDLCALC, LDLDIRECT, TRIG, CHOLHDL No results for input(s): LIPOA in the last 8760 hours. No components found for: NTPROBNP No results for input(s): PROBNP in the last 8760 hours. No results for input(s): TSH in the last 8760 hours.  Physical Exam:    Today's Vitals   07/17/24 0844  BP: 138/73  Pulse: 67  Resp: 16  SpO2: 96%  Weight: 200 lb 6.4 oz (90.9 kg)  Height: 5' 5 (1.651 m)   Body mass index is 33.35 kg/m. Wt Readings from Last 3 Encounters:  07/17/24 200 lb 6.4 oz (90.9 kg)  07/18/23 219 lb 6.4 oz (99.5 kg)  06/12/23 216 lb (98 kg)    Physical Exam  Constitutional: No distress.  Age appropriate, hemodynamically stable.   Neck: No JVD present.  Cardiovascular: Normal rate, regular rhythm, S1 normal, S2 normal, intact distal pulses and normal pulses. Exam reveals no gallop, no S3 and no S4.  Murmur heard. High-pitched blowing holosystolic murmur is present with a grade of 3/6 at the  apex. Pulmonary/Chest: Effort normal and breath sounds normal. No stridor. She has no wheezes. She has no rales.  Abdominal: Soft. Bowel sounds are normal. She exhibits no distension. There is no abdominal tenderness.  Musculoskeletal:        General: No edema.     Cervical back: Neck supple.  Neurological: She is alert and oriented to person, place, and time. She has intact cranial nerves (2-12).  Skin: Skin is warm and moist.     Impression & Recommendation(s):  Impression:   ICD-10-CM   1. Persistent atrial fibrillation (HCC)  I48.19 EKG 12-Lead    diltiazem  (CARDIZEM  CD) 360 MG 24 hr capsule    flecainide  (TAMBOCOR ) 100 MG tablet    Hemoglobin and hematocrit, blood    Hemoglobin and hematocrit, blood    2. Long term (current) use of anticoagulants  Z79.01     3. Long term current use of antiarrhythmic drug  Z79.899 flecainide  (TAMBOCOR ) 100 MG tablet    4. Sleep apnea, unspecified type  G47.30     5. Benign hypertension  I10 Basic metabolic panel with GFR    Basic metabolic panel with GFR    6. Non-insulin  dependent type 2 diabetes mellitus (HCC)  E11.9     7. Nonrheumatic mitral valve regurgitation  I34.0 ECHOCARDIOGRAM COMPLETE       Recommendation(s):  Persistent atrial fibrillation (HCC) Rate control: Diltiazem . Rhythm control: Flecainide . Probably prophylaxis: Eliquis . History of TEE guided cardioversion 200 J x 1. EKG today illustrates sinus rhythm. Patient does appreciate when she goes in and out of A-fib, averaged 3 episodes over the last 1 year Diltiazem  refilled.  Long term (current) use of anticoagulants Indication: Persistent atrial fibrillation. Does not endorse evidence of bleeding. Risks, benefits, and alternatives to anticoagulation discussed. Recently hemoglobin is 12.5 g/dL as of 0//7974  Long term current use of antiarrhythmic drug Indication: Persistent atrial fibrillation. Currently on flecainide  100 mg p.o. twice daily. EKG independently  reviewed. Most recent labs note stable renal function and Potassium levels  Mitral valve disease: Asymptomatic. Follow-up 3-year echo prior to the next year office visit  Sleep apnea, unspecified type Reemphasize importance of CPAP compliance. Patient states that she tries to get at least 4 hours of therapy.  Benign hypertension Office blood pressures are very well-controlled. Continue hydrochlorothiazide  25 mg p.o. every morning. Continue Lasix  10 mg p.o. daily. Continue ramipril  10 mg p.o. every morning  Non-insulin  dependent type 2 diabetes mellitus (HCC) Reemphasized importance of glycemic control. Currently on ACE inhibitors, statin therapy, Mounjaro   Orders Placed:  Orders Placed This Encounter  Procedures   Basic metabolic panel with GFR    Standing Status:   Future    Number of Occurrences:   1    Expected Date:   07/09/2025    Expiration Date:   10/07/2025   Hemoglobin and hematocrit, blood    Standing Status:   Future    Number of Occurrences:   1    Expected Date:   07/09/2025    Expiration Date:   10/07/2025   EKG 12-Lead   ECHOCARDIOGRAM COMPLETE    Standing Status:   Future    Expected Date:   06/08/2025    Expiration Date:   09/06/2025    Where [facility] test [facility] performed[facility]    Does the patient weigh less than or greater than 250 lbs?:   Patient weighs less than 250 lbs    Perflutren DEFINITY (image enhancing agent) should be administered unless hypersensitivity or allergy exist:   Administer Perflutren    Reason for exam-Echo:   Other-Full Diagnosis List    Full ICD-10/Reason for Exam:   Mitral regurgitation [201046]     Final Medication List:    Meds ordered this encounter  Medications   diltiazem  (CARDIZEM   CD) 360 MG 24 hr capsule    Sig: Take 1 capsule (360 mg total) by mouth every morning.    Dispense:  90 capsule    Refill:  3   flecainide  (TAMBOCOR ) 100 MG tablet    Sig: Take 1 tablet (100 mg total) by mouth 2 (two)  times daily.    Dispense:  180 tablet    Refill:  3    Medications Discontinued During This Encounter  Medication Reason   clotrimazole -betamethasone  (LOTRISONE ) cream Patient Preference   MOUNJARO 7.5 MG/0.5ML Pen Dose change   oxybutynin (DITROPAN) 5 MG tablet Patient Preference   flecainide  (TAMBOCOR ) 100 MG tablet Reorder   diltiazem  (CARDIZEM  CD) 360 MG 24 hr capsule Reorder     Current Outpatient Medications:    acetaminophen  (TYLENOL ) 500 MG tablet, Take 1,000 mg by mouth 2 (two) times daily., Disp: , Rfl:    Ascorbic Acid (VITAMIN C PO), Take 1,000 mg by mouth daily., Disp: , Rfl:    atorvastatin  (LIPITOR) 10 MG tablet, Take 5 mg by mouth daily., Disp: , Rfl:    buPROPion (WELLBUTRIN XL) 150 MG 24 hr tablet, Take 150 mg by mouth every morning., Disp: , Rfl:    Calcium  Carbonate-Vit D-Min (CALCIUM  1200 PO), Take 600 mg by mouth daily., Disp: , Rfl:    docusate sodium  (COLACE) 100 MG capsule, Take 100 mg by mouth daily., Disp: , Rfl:    ELIQUIS  5 MG TABS tablet, TAKE 1 TABLET TWICE A DAY, Disp: 180 tablet, Rfl: 1   ferrous sulfate  325 (65 FE) MG tablet, Take 325 mg by mouth 3 (three) times a week., Disp: , Rfl:    furosemide  (LASIX ) 20 MG tablet, Take 10 mg by mouth as needed for fluid. TAKE 1/2 TABLET, Disp: , Rfl:    hydrochlorothiazide  (HYDRODIURIL ) 25 MG tablet, Take 25 mg by mouth every morning., Disp: , Rfl: 0   levothyroxine  (SYNTHROID , LEVOTHROID) 150 MCG tablet, Take 150 mcg by mouth daily before breakfast., Disp: , Rfl:    metFORMIN (GLUCOPHAGE) 1000 MG tablet, Take 1,000 mg by mouth 2 (two) times daily with a meal., Disp: , Rfl:    MOUNJARO 12.5 MG/0.5ML Pen, Inject 12.5 mg into the skin once a week., Disp: , Rfl:    Multiple Vitamins-Minerals (CENTRUM SILVER  PO), Take 1 tablet by mouth daily., Disp: , Rfl:    ramipril  (ALTACE ) 10 MG capsule, Take 10 mg by mouth every morning., Disp: , Rfl:    Vitamin D3 (VITAMIN D) 25 MCG tablet, Take 1,000 Units by mouth daily., Disp:  , Rfl:    diltiazem  (CARDIZEM  CD) 360 MG 24 hr capsule, Take 1 capsule (360 mg total) by mouth every morning., Disp: 90 capsule, Rfl: 3   flecainide  (TAMBOCOR ) 100 MG tablet, Take 1 tablet (100 mg total) by mouth 2 (two) times daily., Disp: 180 tablet, Rfl: 3  Consent:   NA  Disposition:   1 year follow-up sooner if needed, will order labs H&H, BMP prior to next office visit  Her questions and concerns were addressed to her satisfaction. She voices understanding of the recommendations provided during this encounter.    Signed, Madonna Michele HAS, [facility]  A Division of [facility] [address], [place], [place] [address]  07/17/2024 10:22 AM

## 2024-07-17 NOTE — Patient Instructions (Addendum)
 Medication Instructions:  Refilled Cardizem   Refilled Flecainide  *If you need a refill on your cardiac medications before your next appointment, please call your pharmacy*  Lab Work IN [facility] 2026: H and H BMP If you have labs (blood work) drawn today and your tests are completely normal, you will receive your results only by: MyChart Message (if you have MyChart) OR A paper copy in the mail If you have any lab test that is abnormal or we need to change your treatment, we will call you to review the results.  Testing/Procedures: ECHOCARDIOGRAM IN AUGUST 2026 Your physician has requested that you have an echocardiogram. Echocardiography is a painless test that uses sound waves to create images of your heart. It provides your doctor with information about the size and shape of your heart and how well your heart's chambers and valves are working. This procedure takes approximately one hour. There are no restrictions for this procedure. Please do NOT wear cologne, perfume, aftershave, or lotions (deodorant is allowed). Please arrive 15 minutes prior to your appointment time.  Please note: We ask at that you not bring children with you during ultrasound (echo/ vascular) testing. Due to room size and safety concerns, children are not allowed in the ultrasound rooms during exams. Our front office staff cannot provide observation of children in our lobby area while testing is being conducted. An adult accompanying a patient to their appointment will only be allowed in the ultrasound room at the discretion of the ultrasound technician under special circumstances. We apologize for any inconvenience.   Follow-Up: At [facility], you and your health needs are our priority.  As part of our continuing mission to provide you with exceptional heart care, our providers are all part of one team.  This team includes your primary Cardiologist (physician) and Advanced Practice Providers or APPs  (Physician Assistants and Nurse Practitioners) who all work together to provide you with the care you need, when you need it.  Your next appointment:   September 2026  Provider:   Madonna Large, DO    We recommend signing up for the patient portal called MyChart.  Sign up information is provided on this After Visit Summary.  MyChart is used to connect with patients for Virtual Visits (Telemedicine).  Patients are able to view lab/test results, encounter notes, upcoming appointments, etc.  Non-urgent messages can be sent to your provider as well.   To learn more about what you can do with MyChart, go to [email or web address].

## 2024-09-02 ENCOUNTER — Other Ambulatory Visit: Payer: Self-pay | Admitting: Cardiology

## 2024-09-02 DIAGNOSIS — Z79899 Other long term (current) drug therapy: Secondary | ICD-10-CM

## 2024-09-02 DIAGNOSIS — I4819 Other persistent atrial fibrillation: Secondary | ICD-10-CM

## 2024-09-03 ENCOUNTER — Telehealth: Payer: Self-pay | Admitting: Cardiology

## 2024-09-03 DIAGNOSIS — Z79899 Other long term (current) drug therapy: Secondary | ICD-10-CM

## 2024-09-03 DIAGNOSIS — I4819 Other persistent atrial fibrillation: Secondary | ICD-10-CM

## 2024-09-03 MED ORDER — FLECAINIDE ACETATE 100 MG PO TABS
100.0000 mg | ORAL_TABLET | Freq: Two times a day (BID) | ORAL | 3 refills | Status: AC
Start: 2024-09-03 — End: ?

## 2024-09-03 NOTE — Telephone Encounter (Signed)
*  STAT* If patient is at the pharmacy, call can be transferred to refill team.   1. Which medications need to be refilled? (please list name of each medication and dose if known) flecainide  (TAMBOCOR ) 100 MG tablet    2. Would you like to learn more about the convenience, safety, & potential cost savings by using the [facility]? No   3. Are you open to using the [facility] (Type [facility] ) No   4. Which pharmacy/location (including street and city if local pharmacy) is medication to be sent to?  [facility] - [place], [place] - [address] [facility] AT Portal to [facility] Sites   5. Do they need a 30 day or 90 day supply? 90 ay  Previous request sent to wrong pharmacy

## 2024-09-03 NOTE — Telephone Encounter (Signed)
 Flecainide  refill sent to [facility].

## 2024-11-27 ENCOUNTER — Other Ambulatory Visit: Payer: Self-pay | Admitting: Cardiology

## 2024-11-27 DIAGNOSIS — I4819 Other persistent atrial fibrillation: Secondary | ICD-10-CM

## 2024-11-27 DIAGNOSIS — Z7901 Long term (current) use of anticoagulants: Secondary | ICD-10-CM

## 2024-12-11 ENCOUNTER — Ambulatory Visit: Admitting: Podiatry

## 2024-12-14 ENCOUNTER — Other Ambulatory Visit: Payer: Self-pay | Admitting: Family Medicine

## 2024-12-14 DIAGNOSIS — Z1231 Encounter for screening mammogram for malignant neoplasm of breast: Secondary | ICD-10-CM

## 2025-01-22 ENCOUNTER — Ambulatory Visit

## 2025-06-11 ENCOUNTER — Other Ambulatory Visit (HOSPITAL_COMMUNITY)
